# Patient Record
Sex: Female | Born: 1975
Health system: Southern US, Community
[De-identification: ages and names within clinical notes are randomized; demographics above are authoritative.]

## PROBLEM LIST (undated history)

## (undated) DIAGNOSIS — I10 Essential (primary) hypertension: Secondary | ICD-10-CM

---

## 2013-05-01 ENCOUNTER — Ambulatory Visit: Payer: Self-pay | Admitting: Physician Assistant

## 2013-05-01 LAB — CBC WITH DIFFERENTIAL/PLATELET
Basophil #: 0.1 10*3/uL (ref 0.0–0.1)
Basophil %: 0.6 %
Eosinophil #: 0.1 10*3/uL (ref 0.0–0.7)
Eosinophil %: 1 %
HGB: 13.8 g/dL (ref 12.0–16.0)
Lymphocyte #: 2.2 10*3/uL (ref 1.0–3.6)
Lymphocyte %: 25.2 %
MCV: 100 fL (ref 80–100)
Monocyte #: 0.6 x10 3/mm (ref 0.2–0.9)
Monocyte %: 7.1 %
Neutrophil #: 5.7 10*3/uL (ref 1.4–6.5)
Platelet: 232 10*3/uL (ref 150–440)
RBC: 4.09 10*6/uL (ref 3.80–5.20)
WBC: 8.6 10*3/uL (ref 3.6–11.0)

## 2014-10-06 ENCOUNTER — Emergency Department
Admit: 2014-10-06 | Disposition: A | Discharge status: Home / Self Care | Payer: Self-pay | Admitting: Emergency Medicine

## 2015-01-08 ENCOUNTER — Emergency Department
Admission: EM | Admit: 2015-01-08 | Discharge: 2015-01-08 | Disposition: A | Discharge status: Home / Self Care | Payer: Self-pay | Attending: Emergency Medicine | Admitting: Emergency Medicine

## 2015-01-08 DIAGNOSIS — Y9289 Other specified places as the place of occurrence of the external cause: Secondary | ICD-10-CM | POA: Insufficient documentation

## 2015-01-08 DIAGNOSIS — Y998 Other external cause status: Secondary | ICD-10-CM | POA: Insufficient documentation

## 2015-01-08 DIAGNOSIS — S40861A Insect bite (nonvenomous) of right upper arm, initial encounter: Secondary | ICD-10-CM | POA: Insufficient documentation

## 2015-01-08 DIAGNOSIS — L089 Local infection of the skin and subcutaneous tissue, unspecified: Secondary | ICD-10-CM

## 2015-01-08 DIAGNOSIS — W57XXXA Bitten or stung by nonvenomous insect and other nonvenomous arthropods, initial encounter: Secondary | ICD-10-CM | POA: Insufficient documentation

## 2015-01-08 DIAGNOSIS — Y9389 Activity, other specified: Secondary | ICD-10-CM | POA: Insufficient documentation

## 2015-01-08 MED ORDER — METHYLPREDNISOLONE SODIUM SUCC 125 MG IJ SOLR
125.0000 mg | Freq: Once | INTRAMUSCULAR | Status: AC
  Administered 2015-01-08: 125 mg via INTRAMUSCULAR
  Filled 2015-01-08: qty 2

## 2015-01-08 MED ORDER — HYDROXYZINE PAMOATE 25 MG PO CAPS: 25.0000 mg | ORAL_CAPSULE | Freq: Three times a day (TID) | ORAL | Status: DC | PRN

## 2015-01-08 MED ORDER — RANITIDINE HCL 150 MG PO TABS: 150.0000 mg | ORAL_TABLET | Freq: Two times a day (BID) | ORAL | Status: DC

## 2015-01-08 MED ORDER — EPINEPHRINE 0.3 MG/0.3ML IJ SOAJ: 0.3000 mg | Freq: Once | INTRAMUSCULAR | Status: DC

## 2015-01-08 NOTE — Discharge Instructions (Signed)

## 2015-01-08 NOTE — ED Notes (Signed)
Pt reports bug bite Sunday to rt arm, swelling and pain since.

## 2015-01-08 NOTE — ED Provider Notes (Signed)
[facility] Emergency Department Provider Note  ____________________________________________  Time seen: Approximately 2:50 PM  I have reviewed the triage vital signs and the nursing notes.   HISTORY  Chief Complaint Insect Bite    HPI Veronica Holt is a 39 y.o. female who presents for evaluation of an insect bite to her right forearm yesterday. States that the arm is swollen tender and hot to touch. Denies any difficulty breathing or shortness of breath  No past medical history on file.  There are no active problems to display for this patient.   No past surgical history on file.  Current Outpatient Rx  Name  Route  Sig  Dispense  Refill  . EPINEPHrine 0.3 mg/0.3 mL IJ SOAJ injection   Intramuscular   Inject 0.3 mLs (0.3 mg total) into the muscle once.   1 Device   1   . hydrOXYzine (VISTARIL) 25 MG capsule   Oral   Take 1 capsule (25 mg total) by mouth 3 (three) times daily as needed.   30 capsule   0   . ranitidine (ZANTAC) 150 MG tablet   Oral   Take 1 tablet (150 mg total) by mouth 2 (two) times daily.   14 tablet   1     Allergies Review of patient's allergies indicates no known allergies.  No family history on file.  Social History History  Substance Use Topics  . Smoking status: Not on file  . Smokeless tobacco: Not on file  . Alcohol Use: Not on file    Review of Systems Constitutional: No fever/chills Eyes: No visual changes. ENT: No sore throat. Cardiovascular: Denies chest pain. Respiratory: Denies shortness of breath. Gastrointestinal: No abdominal pain.  No nausea, no vomiting.  No diarrhea.  No constipation. Genitourinary: Negative for dysuria. Musculoskeletal: Negative for back pain. Skin: Positive for erythema around noontime from forearm of the right arm circumferentially erythematous and warm to palpation skin is taut. Neurological: Negative for headaches, focal weakness or numbness.  10-point ROS  otherwise negative.  ____________________________________________   PHYSICAL EXAM:  VITAL SIGNS: ED Triage Vitals  Enc Vitals Group     BP 01/08/15 1431 139/100 mmHg     Pulse Rate 01/08/15 1431 94     Resp --      Temp 01/08/15 1431 98.4 F (36.9 C)     Temp Source 01/08/15 1431 Oral     SpO2 01/08/15 1431 99 %     Weight 01/08/15 1424 186 lb (84.369 kg)     Height 01/08/15 1424 5\' 5"  (1.651 m)     Head Cir --      Peak Flow --      Pain Score 01/08/15 1424 8     Pain Loc --      Pain Edu? --      Excl. in [place]? --     Constitutional: Alert and oriented. Well appearing and in no acute distress. Mouth/Throat: Mucous membranes are moist.  Oropharynx non-erythematous. Neck: No stridor.   Cardiovascular: Normal rate, regular rhythm. Grossly normal heart sounds.  Good peripheral circulation. Respiratory: Normal respiratory effort.  No retractions. Lungs CTAB. Musculoskeletal: No lower extremity tenderness nor edema.  No joint effusions. Neurologic:  Normal speech and language. No gross focal neurologic deficits are appreciated. No gait instability. Skin:  Positive for erythema around noontime from forearm of the right arm circumferentially erythematous and warm to palpation skin is taut. Psychiatric: Mood and affect are normal. Speech and behavior are  normal.  ____________________________________________   LABS (all labs ordered are listed, but only abnormal results are displayed)  Labs Reviewed - No data to display ____________________________________________   PROCEDURES  Procedure(s) performed: None  Critical Care performed: No  ____________________________________________   INITIAL IMPRESSION / ASSESSMENT AND PLAN / ED COURSE  Pertinent labs & imaging results that were available during my care of the patient were reviewed by me and considered in my medical decision making (see chart for details).  Insect bite with secondary cellulitis. Concerned about  compartment syndrome. Site Medrol 125 IM given Rx for EpiPen provided. Rx for Vistaril and Zantac provided. No other emergency medical complaints and will return to the ER in 24 hours for recheck. ____________________________________________   FINAL CLINICAL IMPRESSION(S) / ED DIAGNOSES  Final diagnoses:  Insect bite of arm, right, infected, initial encounter      Arlyss Repress, PA-C 01/08/15 1534  Harvest Dark, MD 01/08/15 2220

## 2015-03-06 ENCOUNTER — Emergency Department: Payer: Self-pay

## 2015-03-06 ENCOUNTER — Encounter: Payer: Self-pay | Admitting: *Deleted

## 2015-03-06 ENCOUNTER — Emergency Department
Admission: EM | Admit: 2015-03-06 | Discharge: 2015-03-06 | Disposition: A | Discharge status: Home / Self Care | Payer: Self-pay | Attending: Emergency Medicine | Admitting: Emergency Medicine

## 2015-03-06 DIAGNOSIS — M722 Plantar fascial fibromatosis: Secondary | ICD-10-CM | POA: Insufficient documentation

## 2015-03-06 DIAGNOSIS — Z720 Tobacco use: Secondary | ICD-10-CM | POA: Insufficient documentation

## 2015-03-06 DIAGNOSIS — I10 Essential (primary) hypertension: Secondary | ICD-10-CM | POA: Insufficient documentation

## 2015-03-06 DIAGNOSIS — Z79899 Other long term (current) drug therapy: Secondary | ICD-10-CM | POA: Insufficient documentation

## 2015-03-06 DIAGNOSIS — M7732 Calcaneal spur, left foot: Secondary | ICD-10-CM | POA: Insufficient documentation

## 2015-03-06 HISTORY — DX: Essential (primary) hypertension: I10

## 2015-03-06 MED ORDER — IBUPROFEN 800 MG PO TABS: 800.0000 mg | ORAL_TABLET | Freq: Three times a day (TID) | ORAL | Status: DC | PRN

## 2015-03-06 MED ORDER — IBUPROFEN 800 MG PO TABS
800.0000 mg | ORAL_TABLET | Freq: Once | ORAL | Status: AC
  Administered 2015-03-06: 800 mg via ORAL
  Filled 2015-03-06: qty 1

## 2015-03-06 NOTE — ED Provider Notes (Signed)
[facility] Emergency Department Provider Note  ____________________________________________  Time seen: 4:00 AM  I have reviewed the triage vital signs and the nursing notes.   HISTORY  Chief Complaint Foot Pain      HPI Veronica Holt is a 39 y.o. female presents with left foot pain times several months. Patient states that the pain is worse after standing for long periods of time or walking. Current pain score is 6 out of 10 and nonradiating.     Past Medical History  Diagnosis Date  . Hypertension     There are no active problems to display for this patient.   History reviewed. No pertinent past surgical history.  Current Outpatient Rx  Name  Route  Sig  Dispense  Refill  . EPINEPHrine 0.3 mg/0.3 mL IJ SOAJ injection   Intramuscular   Inject 0.3 mLs (0.3 mg total) into the muscle once.   1 Device   1   . hydrOXYzine (VISTARIL) 25 MG capsule   Oral   Take 1 capsule (25 mg total) by mouth 3 (three) times daily as needed.   30 capsule   0   . ranitidine (ZANTAC) 150 MG tablet   Oral   Take 1 tablet (150 mg total) by mouth 2 (two) times daily.   14 tablet   1     Allergies Review of patient's allergies indicates no known allergies.  No family history on file.  Social History Social History  Substance Use Topics  . Smoking status: Current Every Day Smoker  . Smokeless tobacco: None  . Alcohol Use: No    Review of Systems  Constitutional: Negative for fever. Eyes: Negative for visual changes. ENT: Negative for sore throat. Cardiovascular: Negative for chest pain. Respiratory: Negative for shortness of breath. Gastrointestinal: Negative for abdominal pain, vomiting and diarrhea. Genitourinary: Negative for dysuria. Musculoskeletal: Negative for back pain.positive left foot pain Skin: Negative for rash. Neurological: Negative for headaches, focal weakness or numbness.   10-point ROS otherwise  negative.  ____________________________________________   PHYSICAL EXAM:  VITAL SIGNS: ED Triage Vitals  Enc Vitals Group     BP 03/06/15 0113 162/112 mmHg     Pulse Rate 03/06/15 0113 95     Resp 03/06/15 0113 18     Temp 03/06/15 0113 98.5 F (36.9 C)     Temp Source 03/06/15 0113 Oral     SpO2 03/06/15 0113 100 %     Weight --      Height --      Head Cir --      Peak Flow --      Pain Score 03/06/15 0113 10     Pain Loc --      Pain Edu? --      Excl. in [place]? --      Constitutional: Alert and oriented. Well appearing and in no distress. Eyes: Conjunctivae are normal. PERRL. Normal extraocular movements. ENT   Head: Normocephalic and atraumatic.   Nose: No congestion/rhinnorhea.   Mouth/Throat: Mucous membranes are moist.   Neck: No stridor. Hematological/Lymphatic/Immunilogical: No cervical lymphadenopathy. Cardiovascular: Normal rate, regular rhythm. Normal and symmetric distal pulses are present in all extremities. No murmurs, rubs, or gallops. Respiratory: Normal respiratory effort without tachypnea nor retractions. Breath sounds are clear and equal bilaterally. No wheezes/rales/rhonchi. Gastrointestinal: Soft and nontender. No distention. There is no CVA tenderness. Genitourinary: deferred Musculoskeletal: Tenderness to palpation along the plantar fascia and anterior calcaneus.  Skin:  Skin is warm, dry  and intact. No rash noted. Psychiatric: Mood and affect are normal. Speech and behavior are normal. Patient exhibits appropriate insight and judgment.      RADIOLOGY DG Foot 2 Views Left (Final result) Result time: 03/06/15 05:35:30   Final result by Rad Results In Interface (03/06/15 05:35:30)   Narrative:   CLINICAL DATA: Left heel pain. Pain for several months.  EXAM: LEFT FOOT - 2 VIEW  COMPARISON: None.  FINDINGS: No fracture or dislocation. The alignment and joint spaces are maintained. Small plantar calcaneal spur. No soft  tissue abnormality. No radiopaque foreign body.  IMPRESSION: No acute bony abnormality. Small plantar calcaneal spur.   Electronically Signed By: Jeb Levering M.D. On: 03/06/2015 05:35       INITIAL IMPRESSION / ASSESSMENT AND PLAN / ED COURSE  Pertinent labs & imaging results that were available during my care of the patient were reviewed by me and considered in my medical decision making (see chart for details).    ____________________________________________   FINAL CLINICAL IMPRESSION(S) / ED DIAGNOSES  Final diagnoses:  Plantar fasciitis, left  Calcaneal spur of foot, left      Gregor Hams, MD 03/07/15 [phone]

## 2015-03-06 NOTE — Discharge Instructions (Signed)
Plantar Fasciitis °Plantar fasciitis is a common condition that causes foot pain. It is soreness (inflammation) of the band of tough fibrous tissue on the bottom of the foot that runs from the heel bone (calcaneus) to the ball of the foot. The cause of this soreness may be from excessive standing, poor fitting shoes, running on hard surfaces, being overweight, having an abnormal walk, or overuse (this is common in runners) of the painful foot or feet. It is also common in aerobic exercise dancers and ballet dancers. °SYMPTOMS  °Most people with plantar fasciitis complain of: °· Severe pain in the morning on the bottom of their foot especially when taking the first steps out of bed. This pain recedes after a few minutes of walking. °· Severe pain is experienced also during walking following a long period of inactivity. °· Pain is worse when walking barefoot or up stairs °DIAGNOSIS  °· Your caregiver will diagnose this condition by examining and feeling your foot. °· Special tests such as X-rays of your foot, are usually not needed. °PREVENTION  °· Consult a sports medicine professional before beginning a new exercise program. °· Walking programs offer a good workout. With walking there is a lower chance of overuse injuries common to runners. There is less impact and less jarring of the joints. °· Begin all new exercise programs slowly. If problems or pain develop, decrease the amount of time or distance until you are at a comfortable level. °· Wear good shoes and replace them regularly. °· Stretch your foot and the heel cords at the back of the ankle (Achilles tendon) both before and after exercise. °· Run or exercise on even surfaces that are not hard. For example, asphalt is better than pavement. °· Do not run barefoot on hard surfaces. °· If using a treadmill, vary the incline. °· Do not continue to workout if you have foot or joint problems. Seek professional help if they do not improve. °HOME CARE INSTRUCTIONS    °· Avoid activities that cause you pain until you recover. °· Use ice or cold packs on the problem or painful areas after working out. °· Only take over-the-counter or prescription medicines for pain, discomfort, or fever as directed by your caregiver. °· Soft shoe inserts or athletic shoes with air or gel sole cushions may be helpful. °· If problems continue or become more severe, consult a sports medicine caregiver or your own health care provider. Cortisone is a potent anti-inflammatory medication that may be injected into the painful area. You can discuss this treatment with your caregiver. °MAKE SURE YOU:  °· Understand these instructions. °· Will watch your condition. °· Will get help right away if you are not doing well or get worse. °Document Released: 03/03/2001 Document Revised: 08/31/2011 Document Reviewed: 05/02/2008 °ExitCare® Patient Information ©2015 [employer]. This information is not intended to replace advice given to you by your health care provider. Make sure you discuss any questions you have with your health care provider. ° °Heel Spur °A heel spur is a hook of bone that can form on the calcaneus (the heel bone and the largest bone of the foot). Heel spurs are often associated with plantar fasciitis and usually come in people who have had the problem for an extended period of time. The cause of the relationship is unknown. The pain associated with them is thought to be caused by an inflammation (soreness and redness) of the plantar fascia rather than the spur itself. The plantar fascia is a thick   fibrous like tissue that runs from the calcaneus (heel bone) to the ball of the foot. This strong, tight tissue helps maintain the arch of your foot. It helps distribute the weight across your foot as you walk or run. Stresses placed on the plantar fascia can be tremendous. When it is inflamed normal activities become painful. Pain is worse in the morning after sleeping. After sleeping the plantar  fascia is tight. The first movements stretch the fascia and this causes pain. As the tendon loosens, the pain usually gets better. It often returns with too much standing or walking.  °About 70% of patients with plantar fasciitis have a heel spur. About half of people without foot pain also have heel spurs. °DIAGNOSIS  °The diagnosis of a heel spur is made by X-ray. The X-ray shows a hook of bone protruding from the bottom of the calcaneus at the point where the plantar fascia is attached to the heel bone.  °TREATMENT °· It is necessary to find out what is causing the stretching of the plantar fascia. If the cause is over-pronation (flat feet), orthotics and proper foot ware may help. °· Stretching exercises, losing weight, wearing shoes that have a cushioned heel that absorbs shock, and elevating the heel with the use of a heel cradle, heel cup, or orthotics may all help. Heel cradles and heel cups provide extra comfort and cushion to the heel, and reduce the amount of shock to the sore area. °AVOIDING THE PAIN OF PLANTAR FASCIITIS AND HEEL SPURS °· Consult a sports medicine professional before beginning a new exercise program. °· Walking programs offer a good workout. There is a lower chance of overuse injuries common to the runners. There is less impact and less jarring of the joints. °· Begin all new exercise programs slowly. If problems or pains develop, decrease the amount of time or distance until you are at a comfortable level. °· Wear good shoes and replace them regularly. °· Stretch your foot and the heel cords at the back of the ankle (Achilles tendons) both before and after exercise. °· Run or exercise on even surfaces that are not hard. For example, asphalt is better than pavement. °· Do not run barefoot on hard surfaces. °· If using a treadmill, vary the incline. °· Do not continue to workout if you have foot or joint problems. Seek professional help if they do not improve. °HOME CARE INSTRUCTIONS   °· Avoid activities that cause you pain until you recover. °· Use ice or cold packs to the problem or painful areas after working out. °· Only take over-the-counter or prescription medicines for pain, discomfort, or fever as directed by your caregiver. °· Soft shoe inserts or athletic shoes with air or gel sole cushions may be helpful. °· If problems continue or become more severe, consult a sports medicine caregiver. Cortisone is a potent anti-inflammatory medication that may be injected into the painful area. You can discuss this treatment with your caregiver. °MAKE SURE YOU:  °· Understand these instructions. °· Will watch your condition. °· Will get help right away if you are not doing well or get worse. °Document Released: 07/15/2005 Document Revised: 08/31/2011 Document Reviewed: 08/09/2013 °ExitCare® Patient Information ©2015 [employer]. This information is not intended to replace advice given to you by your health care provider. Make sure you discuss any questions you have with your health care provider. ° °

## 2015-03-06 NOTE — ED Notes (Addendum)
Pt reports pain on the bottom of her left foot at her heel area for several months, pain is worse after she sits for long periods and then goes to stand up.

## 2016-07-13 DIAGNOSIS — N62 Hypertrophy of breast: Secondary | ICD-10-CM | POA: Diagnosis not present

## 2016-08-03 DIAGNOSIS — R000 Tachycardia, unspecified: Secondary | ICD-10-CM | POA: Diagnosis not present

## 2016-08-03 DIAGNOSIS — I10 Essential (primary) hypertension: Secondary | ICD-10-CM | POA: Diagnosis not present

## 2016-08-03 DIAGNOSIS — M25511 Pain in right shoulder: Secondary | ICD-10-CM | POA: Diagnosis not present

## 2016-08-03 DIAGNOSIS — R252 Cramp and spasm: Secondary | ICD-10-CM | POA: Diagnosis not present

## 2016-08-03 DIAGNOSIS — Z01810 Encounter for preprocedural cardiovascular examination: Secondary | ICD-10-CM | POA: Diagnosis not present

## 2016-08-03 DIAGNOSIS — M546 Pain in thoracic spine: Secondary | ICD-10-CM | POA: Diagnosis not present

## 2016-08-03 DIAGNOSIS — M62830 Muscle spasm of back: Secondary | ICD-10-CM | POA: Diagnosis not present

## 2016-08-20 DIAGNOSIS — M62830 Muscle spasm of back: Secondary | ICD-10-CM | POA: Diagnosis not present

## 2016-08-20 DIAGNOSIS — I10 Essential (primary) hypertension: Secondary | ICD-10-CM | POA: Diagnosis not present

## 2016-11-30 DIAGNOSIS — M79604 Pain in right leg: Secondary | ICD-10-CM | POA: Diagnosis not present

## 2016-11-30 DIAGNOSIS — M62838 Other muscle spasm: Secondary | ICD-10-CM | POA: Diagnosis not present

## 2016-11-30 DIAGNOSIS — I10 Essential (primary) hypertension: Secondary | ICD-10-CM | POA: Diagnosis not present

## 2016-12-07 DIAGNOSIS — I10 Essential (primary) hypertension: Secondary | ICD-10-CM | POA: Diagnosis not present

## 2016-12-07 DIAGNOSIS — Z131 Encounter for screening for diabetes mellitus: Secondary | ICD-10-CM | POA: Diagnosis not present

## 2017-01-14 ENCOUNTER — Encounter: Payer: Self-pay | Admitting: Physician Assistant

## 2017-01-14 ENCOUNTER — Ambulatory Visit (INDEPENDENT_AMBULATORY_CARE_PROVIDER_SITE_OTHER): Payer: 59 | Admitting: Physician Assistant

## 2017-01-14 VITALS — BP 146/93 | HR 76 | Temp 98.5°F | Resp 16 | Ht 65.0 in | Wt 228.0 lb

## 2017-01-14 DIAGNOSIS — M542 Cervicalgia: Secondary | ICD-10-CM

## 2017-01-14 DIAGNOSIS — R51 Headache: Secondary | ICD-10-CM | POA: Diagnosis not present

## 2017-01-14 DIAGNOSIS — R519 Headache, unspecified: Secondary | ICD-10-CM

## 2017-01-14 MED ORDER — MELOXICAM 7.5 MG PO TABS: 7.5000 mg | ORAL_TABLET | Freq: Every day | ORAL | 0 refills | Status: DC

## 2017-01-14 MED ORDER — TIZANIDINE HCL 2 MG PO CAPS: 2.0000 mg | ORAL_CAPSULE | Freq: Three times a day (TID) | ORAL | 0 refills | Status: DC

## 2017-01-14 NOTE — Patient Instructions (Addendum)
Acupuncture: [place] (sliding scale) or [place].  Try chiropractor and massage.  Apply heating pad to your neck 20-30 min a time 2-3 times daily.  Eat meals on a regular schedule. Limit alcohol use. Decrease your caffeine intake, or stop using caffeine Limit stress.  Sit up straight, and avoid tensing your muscles. Do not use tobacco products, including cigarettes, chewing tobacco, or e-cigarettes. If you need help quitting, ask your health care provider. Exercise regularly as told by your health care provider. Get 7-9 hours of sleep, or the amount recommended by your health care provider.  Come back if you are not improving in 3-4 weeks.  Thank you for coming in today. I hope you feel we met your needs.  Feel free to call PCP if you have any questions or further requests.  Please consider signing up for MyChart if you do not already have it, as this is a great way to communicate with me.  Best,  [employer], PA-C    1. Using good posture, sit on a stable chair or stand up. 2. Without moving your shoulders, slowly tilt your left / right ear to your shoulder until you feel a stretch in your neck muscles. You should be looking straight ahead. 3. Hold for __________ seconds. 4. Repeat with the other side of your neck. Repeat __________ times. Complete this exercise __________ times a day. Exercise B: Cervical rotation  1. Using good posture, sit on a stable chair or stand up. 2. Slowly turn your head to the side as if you are looking over your left / right shoulder. ? Keep your eyes level with the ground. ? Stop when you feel a stretch along the side and the back of your neck. 3. Hold for __________ seconds. 4. Repeat this by turning to your other side. Repeat __________ times. Complete this exercise __________ times a day. Exercise C: Thoracic extension and pectoral stretch 1. Roll a towel or a small blanket so it is about 4 inches (10 cm) in diameter. 2. Lie down on your  back on a firm surface. 3. Put the towel lengthwise, under your spine in the middle of your back. It should not be not under your shoulder blades. The towel should line up with your spine from your middle back to your lower back. 4. Put your hands behind your head and let your elbows fall out to your sides. 5. Hold for __________ seconds. Repeat __________ times. Complete this exercise __________ times a day. Strengthening exercises These exercises build strength and endurance in your neck. Endurance is the ability to use your muscles for a long time, even after your muscles get tired. Exercise D: Upper cervical flexion, isometric 1. Lie on your back with a thin pillow behind your head and a small rolled-up towel under your neck. 2. Gently tuck your chin toward your chest and nod your head down to look toward your feet. Do not lift your head off the pillow. 3. Hold for __________ seconds. 4. Release the tension slowly. Relax your neck muscles completely before you repeat this exercise. Repeat __________ times. Complete this exercise __________ times a day. Exercise E: Cervical extension, isometric  1. Stand about 6 inches (15 cm) away from a wall, with your back facing the wall. 2. Place a soft object, about 6-8 inches (15-20 cm) in diameter, between the back of your head and the wall. A soft object could be a small pillow, a ball, or a folded towel. 3. Gently  tilt your head back and press into the soft object. Keep your jaw and forehead relaxed. 4. Hold for __________ seconds. 5. Release the tension slowly. Relax your neck muscles completely before you repeat this exercise. Repeat __________ times. Complete this exercise __________ times a day. Posture and body mechanics  Body mechanics refers to the movements and positions of your body while you do your daily activities. Posture is part of body mechanics. Good posture and healthy body mechanics can help to relieve stress in your body's  tissues and joints. Good posture means that your spine is in its natural S-curve position (your spine is neutral), your shoulders are pulled back slightly, and your head is not tipped forward. The following are general guidelines for applying improved posture and body mechanics to your everyday activities. Standing  When standing, keep your spine neutral and keep your feet about hip-width apart. Keep a slight bend in your knees. Your ears, shoulders, and hips should line up.  When you do a task in which you stand in one place for a long time, place one foot up on a stable object that is 2-4 inches (5-10 cm) high, such as a footstool. This helps keep your spine neutral. Sitting   When sitting, keep your spine neutral and your keep feet flat on the floor. Use a footrest, if necessary, and keep your thighs parallel to the floor. Avoid rounding your shoulders, and avoid tilting your head forward.  When working at a desk or a computer, keep your desk at a height where your hands are slightly lower than your elbows. Slide your chair under your desk so you are close enough to maintain good posture.  When working at a computer, place your monitor at a height where you are looking straight ahead and you do not have to tilt your head forward or downward to look at the screen. Resting When lying down and resting, avoid positions that are most painful for you. Try to support your neck in a neutral position. You can use a contour pillow or a small rolled-up towel. Your pillow should support your neck but not push on it. This information is not intended to replace advice given to you by your health care provider. Make sure you discuss any questions you have with your health care provider. Document Released: 06/08/2005 Document Revised: 02/13/2016 Document Reviewed: 05/15/2015 Elsevier Interactive Patient Education  2018 [employer].  IF you received an x-ray today, you will receive an invoice from [facility]  Radiology. Please contact [facility] at [phone] with questions or concerns regarding your invoice.   IF you received labwork today, you will receive an invoice from [facility]. Please contact [facility] at [phone] with questions or concerns regarding your invoice.   Our billing staff will not be able to assist you with questions regarding bills from these companies.  You will be contacted with the lab results as soon as they are available. The fastest way to get your results is to activate your My Chart account. Instructions are located on the last page of this paperwork. If you have not heard from [place] regarding the results in 2 weeks, please contact this office.

## 2017-01-14 NOTE — Progress Notes (Signed)
Veronica Holt  MRN: [id number] DOB: 11-01-1975  PCP: Patient, No Pcp Per  Subjective:  Pt is a 41 year old female who presents to clinic for headache, back pain and neck pain. She works in an [employer], television/film set.  Headache x 3 days. Pain starts at base of her skull and radiates over top of her head. Pain is "mild". Does not interfere with daily activities. No visual disturbances. Some photophobia. No phonophobia. She does not have to lay in dark room to feel better. Pain is not throbbing.  Endorses increase in recent life stressors and physical activity. She moved homes last weekend. She does not stay well hydrated.  She is sleeping well.  Low back pain. This is not a new problem. Pain does not radiate. She has large breasts, has discussed this as contributing factor to back pain with her PCP and is planning to have breast reduction surgery in the future.  She has tried Aleve w no relief.   PCP Dr. Janene Harvey at [facility] primary care.   Review of Systems  Constitutional: Negative for chills, diaphoresis, fatigue and fever.  Eyes: Negative for visual disturbance.  Gastrointestinal: Negative for nausea and vomiting.  Musculoskeletal: Positive for back pain and neck pain. Negative for neck stiffness.  Neurological: Positive for headaches. Negative for dizziness, weakness and light-headedness.  Psychiatric/Behavioral: Negative for confusion and sleep disturbance.    There are no active problems to display for this patient.   Current Outpatient Prescriptions on File Prior to Visit  Medication Sig Dispense Refill  . EPINEPHrine 0.3 mg/0.3 mL IJ SOAJ injection Inject 0.3 mLs (0.3 mg total) into the muscle once. (Patient not taking: Reported on 01/14/2017) 1 Device 1  . hydrOXYzine (VISTARIL) 25 MG capsule Take 1 capsule (25 mg total) by mouth 3 (three) times daily as needed. (Patient not taking: Reported on 01/14/2017) 30 capsule 0  . ibuprofen (ADVIL,MOTRIN) 800 MG tablet Take 1 tablet (800 mg total)  by mouth every 8 (eight) hours as needed. (Patient not taking: Reported on 01/14/2017) 30 tablet 0  . ranitidine (ZANTAC) 150 MG tablet Take 1 tablet (150 mg total) by mouth 2 (two) times daily. (Patient not taking: Reported on 01/14/2017) 14 tablet 1   No current facility-administered medications on file prior to visit.     No Known Allergies   Objective:  BP (!) 146/93 (BP Location: Right Arm, Patient Position: Sitting, Cuff Size: Large)   Pulse 76   Temp 98.5 F (36.9 C) (Oral)   Resp 16   Ht 5\' 5"  (1.651 m)   Wt 228 lb (103.4 kg)   LMP 12/31/2016   SpO2 98%   BMI 37.94 kg/m   Physical Exam  Constitutional: She is oriented to person, place, and time and well-developed, well-nourished, and in no distress. No distress.  Eyes: Pupils are equal, round, and reactive to light. Right eye exhibits no nystagmus. Left eye exhibits no nystagmus.  Cardiovascular: Normal rate, regular rhythm and normal heart sounds.   Neurological: She is alert and oriented to person, place, and time. She has normal motor skills. GCS score is 15.  Skin: Skin is warm and dry.  Psychiatric: Mood, memory, affect and judgment normal.  Vitals reviewed.   Assessment and Plan :  1. Nonintractable episodic headache, unspecified headache type 2. Neck pain - tizanidine (ZANAFLEX) 2 MG capsule; Take 1 capsule (2 mg total) by mouth 3 (three) times daily. May take 4mg  3 times daily  Dispense: 30 capsule; Refill: 0 -  meloxicam (MOBIC) 7.5 MG tablet; Take 1 tablet (7.5 mg total) by mouth daily. Max dose 15mg /day.  Dispense: 30 tablet; Refill: 0 - Suspect recent increased life stressors and physical activity are contributing factors to neck pain. Suspect headache is secondary to neck pain. No red flag signs or symptoms.  Advised heat, massage, hydration and stretching. RTC in 3-4 weeks if no improvement.  She agrees with plan.   Mercer Pod, PA-C  Primary Care at [place] 01/14/2017 9:50 AM

## 2017-02-05 ENCOUNTER — Telehealth: Payer: Self-pay

## 2017-02-05 NOTE — Telephone Encounter (Signed)
Received clarification request from [facility] if OK to change Tizanidine 2mg  Cap to Tablets. Capsules unavailable. Ok Per [employer] PA, to change to tablets. Pharmacy notified.

## 2017-02-24 ENCOUNTER — Ambulatory Visit (INDEPENDENT_AMBULATORY_CARE_PROVIDER_SITE_OTHER): Payer: 59 | Admitting: Physician Assistant

## 2017-02-24 ENCOUNTER — Encounter: Payer: Self-pay | Admitting: Physician Assistant

## 2017-02-24 VITALS — BP 126/85 | HR 74 | Temp 98.7°F | Resp 17 | Ht 65.5 in | Wt 226.0 lb

## 2017-02-24 DIAGNOSIS — R519 Headache, unspecified: Secondary | ICD-10-CM

## 2017-02-24 DIAGNOSIS — R51 Headache: Secondary | ICD-10-CM | POA: Diagnosis not present

## 2017-02-24 LAB — POCT URINALYSIS DIP (MANUAL ENTRY)
BILIRUBIN UA: NEGATIVE mg/dL
Bilirubin, UA: NEGATIVE
Blood, UA: NEGATIVE
Glucose, UA: NEGATIVE mg/dL
LEUKOCYTES UA: NEGATIVE
Nitrite, UA: NEGATIVE
PH UA: 5.5 (ref 5.0–8.0)
PROTEIN UA: NEGATIVE mg/dL
SPEC GRAV UA: 1.025 (ref 1.010–1.025)
UROBILINOGEN UA: 1 U/dL

## 2017-02-24 LAB — POCT CBC
Granulocyte percent: 59.7 %G (ref 37–80)
HCT, POC: 40.6 % (ref 37.7–47.9)
Hemoglobin: 14.4 g/dL (ref 12.2–16.2)
Lymph, poc: 2.8 (ref 0.6–3.4)
MCH, POC: 31.6 pg — AB (ref 27–31.2)
MCHC: 35.5 g/dL — AB (ref 31.8–35.4)
MCV: 89 fL (ref 80–97)
MID (CBC): 0.7 (ref 0–0.9)
MPV: 7.8 fL (ref 0–99.8)
PLATELET COUNT, POC: 259 10*3/uL (ref 142–424)
POC Granulocyte: 5.1 (ref 2–6.9)
POC LYMPH %: 32.3 % (ref 10–50)
POC MID %: 8 % (ref 0–12)
RBC: 4.56 M/uL (ref 4.04–5.48)
RDW, POC: 12.6 %
WBC: 8.6 10*3/uL (ref 4.6–10.2)

## 2017-02-24 LAB — POCT URINE PREGNANCY: PREG TEST UR: NEGATIVE

## 2017-02-24 MED ORDER — FLUTICASONE PROPIONATE 50 MCG/ACT NA SUSP: 2.0000 | Freq: Every day | NASAL | 1 refills | Status: DC

## 2017-02-24 NOTE — Patient Instructions (Addendum)
Please stop the energy drinks.   Please use saline nasal rinse and flonase to open the nasal passages. I would like you to increase your hydration to 64oz per day which is about 64 oz per day.   I will follow up with the lab results.  General Headache Without Cause A headache is pain or discomfort felt around the head or neck area. There are many causes and types of headaches. In some cases, the cause may not be found. Follow these instructions at home: Managing pain  Take over-the-counter and prescription medicines only as told by your doctor.  Lie down in a dark, quiet room when you have a headache.  If directed, apply ice to the head and neck area: ? Put ice in a plastic bag. ? Place a towel between your skin and the bag. ? Leave the ice on for 20 minutes, 2-3 times per day.  Use a heating pad or hot shower to apply heat to the head and neck area as told by your doctor.  Keep lights dim if bright lights bother you or make your headaches worse. Eating and drinking  Eat meals on a regular schedule.  Lessen how much alcohol you drink.  Lessen how much caffeine you drink, or stop drinking caffeine. General instructions  Keep all follow-up visits as told by your doctor. This is important.  Keep a journal to find out if certain things bring on headaches. For example, write down: ? What you eat and drink. ? How much sleep you get. ? Any change to your diet or medicines.  Relax by getting a massage or doing other relaxing activities.  Lessen stress.  Sit up straight. Do not tighten (tense) your muscles.  Do not use tobacco products. This includes cigarettes, chewing tobacco, or e-cigarettes. If you need help quitting, ask your doctor.  Exercise regularly as told by your doctor.  Get enough sleep. This often means 7-9 hours of sleep. Contact a doctor if:  Your symptoms are not helped by medicine.  You have a headache that feels different than the other  headaches.  You feel sick to your stomach (nauseous) or you throw up (vomit).  You have a fever. Get help right away if:  Your headache becomes really bad.  You keep throwing up.  You have a stiff neck.  You have trouble seeing.  You have trouble speaking.  You have pain in the eye or ear.  Your muscles are weak or you lose muscle control.  You lose your balance or have trouble walking.  You feel like you will pass out (faint) or you pass out.  You have confusion. This information is not intended to replace advice given to you by your health care provider. Make sure you discuss any questions you have with your health care provider. Document Released: 03/17/2008 Document Revised: 11/14/2015 Document Reviewed: 10/01/2014 Elsevier Interactive Patient Education  2018 [employer].      IF you received an x-ray today, you will receive an invoice from [facility]. Please contact [facility] at [phone] with questions or concerns regarding your invoice.   IF you received labwork today, you will receive an invoice from [facility]. Please contact [facility] at [phone] with questions or concerns regarding your invoice.   Our billing staff will not be able to assist you with questions regarding bills from these companies.  You will be contacted with the lab results as soon as they are available. The fastest way to get your results  is to activate your My Chart account. Instructions are located on the last page of this paperwork. If you have not heard from us regarding the results in 2 weeks, please contact this office.     

## 2017-02-24 NOTE — Progress Notes (Signed)
PRIMARY CARE AT [facility] [address], [place] [address] [phone]  Date:  02/24/2017   Name:  Veronica Holt   DOB:  Apr 20, 1976   MRN:  [id number]  PCP:  System, Pcp Not In    History of Present Illness:  Veronica Holt is a 41 y.o. female patient who presents to PCP with  Chief Complaint  Patient presents with  . hbp     --3 days of headache at the front of face.  She has nausea associated and no appetite.  Subjective fever and chills.  She has seen some spotting during those times.  No dizziness.  She missed doses for a couple weeks.  She has had her medication for the last 2 weeks.  Her pcp is Veronica Holt at [facility] primary care.  She has blood pressure hydrochlorothiazide which did not help.  She has been having a dry cough for the last for a year.  She also states that within less than 24 hours of drinking a large energy drink, her symptoms started.  She does not frequently drink this. She has also had some nasal congestion.  --checked blood pressure yesterday, and BP was 156/104.  No lower leg swelling.  She has been having some lower leg spasms.  She is drinking about 2 bottles per day.  She has just been taking her medications.  No added stressors besides work.      There are no active problems to display for this patient.   No past medical history on file.  No past surgical history on file.  Social History  Substance Use Topics  . Smoking status: Former Smoker    Quit date: 01/14/2013  . Smokeless tobacco: Never Used  . Alcohol use No    No family history on file.  No Known Allergies  Medication list has been reviewed and updated.  Current Outpatient Prescriptions on File Prior to Visit  Medication Sig Dispense Refill  . EPINEPHrine 0.3 mg/0.3 mL IJ SOAJ injection Inject 0.3 mLs (0.3 mg total) into the muscle once. (Patient not taking: Reported on 01/14/2017) 1 Device 1   No current facility-administered medications on file prior to visit.     ROS ROS  otherwise unremarkable unless listed above.  Physical Examination: BP 126/85   Pulse 74   Temp 98.7 F (37.1 C) (Oral)   Resp 17   Ht 5' 5.5" (1.664 m)   Wt 226 lb (102.5 kg)   LMP 02/07/2017 (Approximate)   SpO2 98%   BMI 37.04 kg/m  Ideal Body Weight: Weight in (lb) to have BMI = 25: 152.2  Physical Exam  Constitutional: She is oriented to person, place, and time. She appears well-developed and well-nourished. No distress.  HENT:  Head: Normocephalic and atraumatic.  Right Ear: Tympanic membrane, external ear and ear canal normal.  Left Ear: Tympanic membrane, external ear and ear canal normal.  Nose: Mucosal edema and rhinorrhea present. Right sinus exhibits no maxillary sinus tenderness and no frontal sinus tenderness. Left sinus exhibits no maxillary sinus tenderness and no frontal sinus tenderness.  Mouth/Throat: No uvula swelling. No oropharyngeal exudate, posterior oropharyngeal edema or posterior oropharyngeal erythema.  Tender at the upper area of nasal bridge between eyes.  Eyes: Pupils are equal, round, and reactive to light. Conjunctivae and EOM are normal.  Cardiovascular: Normal rate and regular rhythm.  Exam reveals no gallop, no distant heart sounds and no friction rub.   No murmur heard. Pulmonary/Chest: Effort normal. No apnea.  No respiratory distress. She has no decreased breath sounds. She has no wheezes. She has no rhonchi.  Lymphadenopathy:       Head (right side): No submandibular, no tonsillar, no preauricular and no posterior auricular adenopathy present.       Head (left side): No submandibular, no tonsillar, no preauricular and no posterior auricular adenopathy present.  Neurological: She is alert and oriented to person, place, and time. She has normal strength. No cranial nerve deficit.  Skin: She is not diaphoretic.  Psychiatric: She has a normal mood and affect. Her behavior is normal.     Assessment and Plan: Veronica Holt is a 41 y.o. female  who is here today for cc of  Chief Complaint  Patient presents with  . hbp  advised to stop energy drinks Saline nasal rinse  And Flonase to open nasal passages. Advised hydration 64 oz per day. May consider abx for head pain if symptoms do not improve. Acute nonintractable headache, unspecified headache type - Plan: POCT urine pregnancy, Basic metabolic panel, TSH, POCT urinalysis dipstick, POCT CBC, CANCELED: CBC  Ivar Drape, PA-C [facility] 9/9/20187:29 PM

## 2017-02-25 LAB — BASIC METABOLIC PANEL
BUN / CREAT RATIO: 16 (ref 9–23)
BUN: 10 mg/dL (ref 6–24)
CO2: 21 mmol/L (ref 20–29)
Calcium: 9 mg/dL (ref 8.7–10.2)
Chloride: 101 mmol/L (ref 96–106)
Creatinine, Ser: 0.64 mg/dL (ref 0.57–1.00)
GFR calc Af Amer: 129 mL/min/{1.73_m2} (ref >59)
GFR calc non Af Amer: 112 mL/min/{1.73_m2} (ref >59)
GLUCOSE: 98 mg/dL (ref 65–99)
POTASSIUM: 4.1 mmol/L (ref 3.5–5.2)
SODIUM: 135 mmol/L (ref 134–144)

## 2017-02-25 LAB — TSH: TSH: 1.46 u[IU]/mL (ref 0.450–4.500)

## 2017-03-01 ENCOUNTER — Other Ambulatory Visit: Payer: Self-pay | Admitting: Physician Assistant

## 2017-03-01 MED ORDER — CETIRIZINE HCL 10 MG PO TABS: 10.0000 mg | ORAL_TABLET | Freq: Every day | ORAL | 1 refills | Status: DC

## 2017-03-02 DIAGNOSIS — Z1231 Encounter for screening mammogram for malignant neoplasm of breast: Secondary | ICD-10-CM | POA: Diagnosis not present

## 2017-03-02 DIAGNOSIS — Z01419 Encounter for gynecological examination (general) (routine) without abnormal findings: Secondary | ICD-10-CM | POA: Diagnosis not present

## 2017-03-02 DIAGNOSIS — N926 Irregular menstruation, unspecified: Secondary | ICD-10-CM | POA: Diagnosis not present

## 2017-03-10 DIAGNOSIS — N920 Excessive and frequent menstruation with regular cycle: Secondary | ICD-10-CM | POA: Diagnosis not present

## 2017-03-10 DIAGNOSIS — N760 Acute vaginitis: Secondary | ICD-10-CM | POA: Diagnosis not present

## 2017-05-24 ENCOUNTER — Ambulatory Visit: Payer: 59 | Admitting: Physician Assistant

## 2017-05-24 DIAGNOSIS — M545 Low back pain: Secondary | ICD-10-CM | POA: Diagnosis not present

## 2017-08-11 ENCOUNTER — Encounter: Payer: Self-pay | Admitting: Physician Assistant

## 2017-08-11 ENCOUNTER — Other Ambulatory Visit: Payer: Self-pay

## 2017-08-11 ENCOUNTER — Ambulatory Visit: Payer: 59 | Admitting: Physician Assistant

## 2017-08-11 VITALS — HR 92 | Temp 98.5°F | Resp 16 | Ht 66.0 in | Wt 223.6 lb

## 2017-08-11 DIAGNOSIS — S39012A Strain of muscle, fascia and tendon of lower back, initial encounter: Secondary | ICD-10-CM | POA: Diagnosis not present

## 2017-08-11 NOTE — Progress Notes (Signed)
PRIMARY CARE AT [place], [place] [address] [phone]  Date:  08/11/2017   Name:  Veronica Holt   DOB:  1976-04-30   MRN:  [id number]  PCP:  System, Pcp Not In    History of Present Illness:  Veronica Holt is a 42 y.o. female patient who presents to PCP with  Chief Complaint  Patient presents with  . Back Pain    lower x 1 wk, per pt "had fallen x 1 wk ago, pain had gone away, but get sharp pain, stiff neck x 3 days ago"     Patient is here for low back pain that has come and gone for the last year.  She notes that over 1 week of a sharp pain.  Aggravated with bending down, getting out of the bed, and pain in her neck, and lifting.  No paresthesia of the extremity.  1 week ago, she fell in her back yard.  She fell on the right side.  No bruising or swelling.  This was a hard dirt surface.  She has using a heating pad, which helps some.       There are no active problems to display for this patient.   No past medical history on file.  No past surgical history on file.  Social History   Tobacco Use  . Smoking status: Former Smoker    Last attempt to quit: 01/14/2013    Years since quitting: 4.5  . Smokeless tobacco: Never Used  Substance Use Topics  . Alcohol use: No  . Drug use: No    No family history on file.  No Known Allergies  Medication list has been reviewed and updated.  Current Outpatient Medications on File Prior to Visit  Medication Sig Dispense Refill  . amLODipine (NORVASC) 10 MG tablet Take 10 mg by mouth daily.    . carvedilol (COREG) 3.125 MG tablet Take 3.125 mg by mouth 2 (two) times daily with a meal.    . furosemide (LASIX) 20 MG tablet Take 20 mg by mouth.    . hydrochlorothiazide (HYDRODIURIL) 25 MG tablet Take 25 mg by mouth daily.    . cetirizine (ZYRTEC) 10 MG tablet Take 1 tablet (10 mg total) by mouth daily. (Patient not taking: Reported on 08/11/2017) 30 tablet 1  . EPINEPHrine 0.3 mg/0.3 mL IJ SOAJ injection Inject  0.3 mLs (0.3 mg total) into the muscle once. (Patient not taking: Reported on 01/14/2017) 1 Device 1  . fluticasone (FLONASE) 50 MCG/ACT nasal spray Place 2 sprays into both nostrils daily. (Patient not taking: Reported on 08/11/2017) 16 g 1   No current facility-administered medications on file prior to visit.     ROS ROS otherwise unremarkable unless listed above.  Physical Examination: Pulse 92   Temp 98.5 F (36.9 C) (Oral)   Resp 16   Ht 5\' 6"  (1.676 m)   Wt 223 lb 9.6 oz (101.4 kg)   LMP 08/04/2017   SpO2 96%   BMI 36.09 kg/m  Ideal Body Weight: Weight in (lb) to have BMI = 25: 154.6  Physical Exam  Constitutional: She is oriented to person, place, and time. She appears well-developed and well-nourished. No distress.  HENT:  Head: Normocephalic and atraumatic.  Right Ear: External ear normal.  Left Ear: External ear normal.  Eyes: Conjunctivae and EOM are normal. Pupils are equal, round, and reactive to light.  Cardiovascular: Normal rate.  Pulmonary/Chest: Effort normal. No respiratory distress.  Musculoskeletal:  Cervical back: She exhibits no bony tenderness.       Lumbar back: She exhibits decreased range of motion and tenderness. She exhibits no bony tenderness, no swelling and no spasm.  Neurological: She is alert and oriented to person, place, and time.  Skin: She is not diaphoretic.  Psychiatric: She has a normal mood and affect. Her behavior is normal.     Assessment and Plan: [name] is a 42 y.o. female who is here today for cc of  Chief Complaint  Patient presents with  . Back Pain    lower x 1 wk, per pt "had fallen x 1 wk ago, pain had gone away, but get sharp pain, stiff neck x 3 days ago"    Strain of lumbar region, initial encounter  Ivar Drape, PA-C [facility] 3/1/201911:23 AM

## 2017-08-11 NOTE — Patient Instructions (Addendum)
Please do not take ibuprofen or naproxen with the mobic.  You can take tylenol Please be mindful of sedation with this medication (cyclobenzaprine).  Do not operate heavy machinery with this medication.   Please make sure you do three exercises three times per day for the neck and low back.  After the stretches, please follow it up with icing.  Pick three pictures of the neck and low back.  You will do the stretches three times per day, thus icing three times per day.    Low Back Strain Rehab Ask your health care provider which exercises are safe for you. Do exercises exactly as told by your health care provider and adjust them as directed. It is normal to feel mild stretching, pulling, tightness, or discomfort as you do these exercises, but you should stop right away if you feel sudden pain or your pain gets worse. Do not begin these exercises until told by your health care provider. Stretching and range of motion exercises These exercises warm up your muscles and joints and improve the movement and flexibility of your back. These exercises also help to relieve pain, numbness, and tingling. Exercise A: Single knee to chest  1. Lie on your back on a firm surface with both legs straight. 2. Bend one of your knees. Use your hands to move your knee up toward your chest until you feel a gentle stretch in your lower back and buttock. ? Hold your leg in this position by holding onto the front of your knee. ? Keep your other leg as straight as possible. 3. Hold for __________ seconds. 4. Slowly return to the starting position. 5. Repeat with your other leg. Repeat __________ times. Complete this exercise __________ times a day. Exercise B: Prone extension on elbows  1. Lie on your abdomen on a firm surface. 2. Prop yourself up on your elbows. 3. Use your arms to help lift your chest up until you feel a gentle stretch in your abdomen and your lower back. ? This will place some of your body weight on  your elbows. If this is uncomfortable, try stacking pillows under your chest. ? Your hips should stay down, against the surface that you are lying on. Keep your hip and back muscles relaxed. 4. Hold for __________ seconds. 5. Slowly relax your upper body and return to the starting position. Repeat __________ times. Complete this exercise __________ times a day. Strengthening exercises These exercises build strength and endurance in your back. Endurance is the ability to use your muscles for a long time, even after they get tired. Exercise C: Pelvic tilt 1. Lie on your back on a firm surface. Bend your knees and keep your feet flat. 2. Tense your abdominal muscles. Tip your pelvis up toward the ceiling and flatten your lower back into the floor. ? To help with this exercise, you may place a small towel under your lower back and try to push your back into the towel. 3. Hold for __________ seconds. 4. Let your muscles relax completely before you repeat this exercise. Repeat __________ times. Complete this exercise __________ times a day. Exercise D: Alternating arm and leg raises  1. Get on your hands and knees on a firm surface. If you are on a hard floor, you may want to use padding to cushion your knees, such as an exercise mat. 2. Line up your arms and legs. Your hands should be below your shoulders, and your knees should be below your hips. 3.  Lift your left leg behind you. At the same time, raise your right arm and straighten it in front of you. ? Do not lift your leg higher than your hip. ? Do not lift your arm higher than your shoulder. ? Keep your abdominal and back muscles tight. ? Keep your hips facing the ground. ? Do not arch your back. ? Keep your balance carefully, and do not hold your breath. 4. Hold for __________ seconds. 5. Slowly return to the starting position and repeat with your right leg and your left arm. Repeat __________ times. Complete this exercise __________times  a day. Exercise J: Single leg lower with bent knees 1. Lie on your back on a firm surface. 2. Tense your abdominal muscles and lift your feet off the floor, one foot at a time, so your knees and hips are bent in an "L" shape (at about 90 degrees). ? Your knees should be over your hips and your lower legs should be parallel to the floor. 3. Keeping your abdominal muscles tense and your knee bent, slowly lower one of your legs so your toe touches the ground. 4. Lift your leg back up to return to the starting position. ? Do not hold your breath. ? Do not let your back arch. Keep your back flat against the ground. 5. Repeat with your other leg. Repeat __________ times. Complete this exercise __________ times a day. Posture and body mechanics  Body mechanics refers to the movements and positions of your body while you do your daily activities. Posture is part of body mechanics. Good posture and healthy body mechanics can help to relieve stress in your body's tissues and joints. Good posture means that your spine is in its natural S-curve position (your spine is neutral), your shoulders are pulled back slightly, and your head is not tipped forward. The following are general guidelines for applying improved posture and body mechanics to your everyday activities. Standing   When standing, keep your spine neutral and your feet about hip-width apart. Keep a slight bend in your knees. Your ears, shoulders, and hips should line up.  When you do a task in which you stand in one place for a long time, place one foot up on a stable object that is 2-4 inches (5-10 cm) high, such as a footstool. This helps keep your spine neutral. Sitting   When sitting, keep your spine neutral and keep your feet flat on the floor. Use a footrest, if necessary, and keep your thighs parallel to the floor. Avoid rounding your shoulders, and avoid tilting your head forward.  When working at a desk or a computer, keep your desk  at a height where your hands are slightly lower than your elbows. Slide your chair under your desk so you are close enough to maintain good posture.  When working at a computer, place your monitor at a height where you are looking straight ahead and you do not have to tilt your head forward or downward to look at the screen. Resting   When lying down and resting, avoid positions that are most painful for you.  If you have pain with activities such as sitting, bending, stooping, or squatting (flexion-based activities), lie in a position in which your body does not bend very much. For example, avoid curling up on your side with your arms and knees near your chest (fetal position).  If you have pain with activities such as standing for a long time or reaching with your  arms (extension-based activities), lie with your spine in a neutral position and bend your knees slightly. Try the following positions: ? Lying on your side with a pillow between your knees. ? Lying on your back with a pillow under your knees. Lifting   When lifting objects, keep your feet at least shoulder-width apart and tighten your abdominal muscles.  Bend your knees and hips and keep your spine neutral. It is important to lift using the strength of your legs, not your back. Do not lock your knees straight out.  Always ask for help to lift heavy or awkward objects. This information is not intended to replace advice given to you by your health care provider. Make sure you discuss any questions you have with your health care provider. Document Released: 06/08/2005 Document Revised: 02/13/2016 Document Reviewed: 03/20/2015 Elsevier Interactive Patient Education  2018 [employer].     IF you received an x-ray today, you will receive an invoice from [facility]. Please contact [facility] at [phone] with questions or concerns regarding your invoice.   IF you received labwork today, you will receive an  invoice from [facility]. Please contact [facility] at [phone] with questions or concerns regarding your invoice.   Our billing staff will not be able to assist you with questions regarding bills from these companies.  You will be contacted with the lab results as soon as they are available. The fastest way to get your results is to activate your My Chart account. Instructions are located on the last page of this paperwork. If you have not heard from [place] regarding the results in 2 weeks, please contact this office.

## 2017-08-19 ENCOUNTER — Telehealth: Payer: Self-pay | Admitting: Physician Assistant

## 2017-08-19 ENCOUNTER — Other Ambulatory Visit: Payer: Self-pay | Admitting: Physician Assistant

## 2017-08-19 DIAGNOSIS — M549 Dorsalgia, unspecified: Secondary | ICD-10-CM

## 2017-08-19 MED ORDER — CYCLOBENZAPRINE HCL 10 MG PO TABS: 5.0000 mg | ORAL_TABLET | Freq: Three times a day (TID) | ORAL | 0 refills | Status: DC | PRN

## 2017-08-19 NOTE — Telephone Encounter (Signed)
Copied from [place]. Topic: Quick Communication - Rx Refill/Question >> Aug 19, 2017  2:17 PM Veronica Holt wrote: Per OV 08/11/17 pt was to have mobic and cyclobenzaprine sent into her pharmacy. She went to pick it up and they do not have any orders. Please send in asap.  [facility] [address] [place] [phone] (Phone) [phone] (Fax)

## 2017-08-20 MED ORDER — MELOXICAM 7.5 MG PO TABS: 7.5000 mg | ORAL_TABLET | Freq: Every day | ORAL | 0 refills | Status: DC

## 2017-08-21 NOTE — Telephone Encounter (Signed)
Rx sent by Ivar Drape PA

## 2017-09-21 ENCOUNTER — Encounter: Payer: Self-pay | Admitting: Physician Assistant

## 2018-03-21 DIAGNOSIS — I10 Essential (primary) hypertension: Secondary | ICD-10-CM | POA: Diagnosis not present

## 2018-03-21 DIAGNOSIS — M549 Dorsalgia, unspecified: Secondary | ICD-10-CM | POA: Diagnosis not present

## 2018-03-23 DIAGNOSIS — I10 Essential (primary) hypertension: Secondary | ICD-10-CM | POA: Diagnosis not present

## 2018-04-12 DIAGNOSIS — N898 Other specified noninflammatory disorders of vagina: Secondary | ICD-10-CM | POA: Diagnosis not present

## 2018-04-20 ENCOUNTER — Ambulatory Visit: Payer: Self-pay | Admitting: Family Medicine

## 2018-05-20 ENCOUNTER — Telehealth: Payer: Self-pay | Admitting: Family Medicine

## 2018-05-20 NOTE — Telephone Encounter (Signed)
LVM for pt to call the office and reschedule their appt on 07/14/18 with Dr. Nolon Rod. When pt calls back, please schedule at their convenience.

## 2018-05-28 ENCOUNTER — Other Ambulatory Visit: Payer: Self-pay

## 2018-05-28 ENCOUNTER — Encounter: Payer: Self-pay | Admitting: Family Medicine

## 2018-05-28 ENCOUNTER — Ambulatory Visit (INDEPENDENT_AMBULATORY_CARE_PROVIDER_SITE_OTHER): Payer: 59 | Admitting: Family Medicine

## 2018-05-28 VITALS — BP 150/82 | HR 81 | Temp 98.0°F | Resp 16 | Ht 65.35 in | Wt 202.0 lb

## 2018-05-28 DIAGNOSIS — H1011 Acute atopic conjunctivitis, right eye: Secondary | ICD-10-CM | POA: Diagnosis not present

## 2018-05-28 DIAGNOSIS — H5789 Other specified disorders of eye and adnexa: Secondary | ICD-10-CM | POA: Diagnosis not present

## 2018-05-28 DIAGNOSIS — H43393 Other vitreous opacities, bilateral: Secondary | ICD-10-CM | POA: Diagnosis not present

## 2018-05-28 MED ORDER — OLOPATADINE HCL 0.2 % OP SOLN: 1.0000 [drp] | Freq: Every day | OPHTHALMIC | 0 refills | Status: AC

## 2018-05-28 NOTE — Progress Notes (Signed)
Chief Complaint  Patient presents with  . Eye Pain    pt states she has noticed drainage,redness, and itching x 4 days     HPI  Patient reports that for 4 days now she has been having watery eyes with redness. No purulent drainage. No eye pain. No blurry vision or decrease in seeing the letters or street signs. For a month now she has been having floaters in her visual field.  She came in today because she started having symptoms after using the leaf blower to clean up the yard. She was wearing goggles but they got foggy so she took them off. She takes zyrtec daily. She denies ear pain or cough.     History reviewed. No pertinent past medical history.  Current Outpatient Medications  Medication Sig Dispense Refill  . amLODipine (NORVASC) 10 MG tablet Take 10 mg by mouth daily.    . carvedilol (COREG) 3.125 MG tablet Take 3.125 mg by mouth 2 (two) times daily with a meal.    . furosemide (LASIX) 20 MG tablet Take 20 mg by mouth.    . hydrochlorothiazide (HYDRODIURIL) 25 MG tablet Take 25 mg by mouth daily.    . Olopatadine HCl 0.2 % SOLN Apply 1 drop to eye daily. 2.5 mL 0   No current facility-administered medications for this visit.     Allergies: No Known Allergies  History reviewed. No pertinent surgical history.  Social History   Socioeconomic History  . Marital status: Single    Spouse name: Not on file  . Number of children: Not on file  . Years of education: Not on file  . Highest education level: Not on file  Occupational History  . Not on file  Social Needs  . Financial resource strain: Not on file  . Food insecurity:    Worry: Not on file    Inability: Not on file  . Transportation needs:    Medical: Not on file    Non-medical: Not on file  Tobacco Use  . Smoking status: Former Smoker    Last attempt to quit: 01/14/2013    Years since quitting: 5.3  . Smokeless tobacco: Never Used  Substance and Sexual Activity  . Alcohol use: No  . Drug use: No  .  Sexual activity: Yes  Lifestyle  . Physical activity:    Days per week: Not on file    Minutes per session: Not on file  . Stress: Not on file  Relationships  . Social connections:    Talks on phone: Not on file    Gets together: Not on file    Attends religious service: Not on file    Active member of club or organization: Not on file    Attends meetings of clubs or organizations: Not on file    Relationship status: Not on file  Other Topics Concern  . Not on file  Social History Narrative  . Not on file    History reviewed. No pertinent family history.   ROS Review of Systems See HPI [place]: No fevers or chills No malaise No diaphoresis Skin: No rash or itching Eyes: see hpi GU: no dysuria or hematuria Neuro: no dizziness or headaches all others reviewed and negative   Objective: Vitals:   05/28/18 1236  BP: (!) 150/82  Pulse: 81  Resp: 16  Temp: 98 F (36.7 C)  TempSrc: Oral  SpO2: 99%  Weight: 202 lb (91.6 kg)  Height: 5' 5.35" (1.66 m)  Physical Exam General: alert, oriented, in NAD Head: normocephalic, atraumatic, no sinus tenderness Eyes: EOM intact, no scleral icterus or conjunctival with mild injection Ears: TM clear bilaterally, bulging  Nose: mucosa nonerythematous, nonedematous Throat: no pharyngeal exudate or erythema Lymph: no posterior auricular, submental or cervical lymph adenopathy Heart: normal rate, normal sinus rhythm, no murmurs Lungs: clear to auscultation bilaterally, no wheezing   Assessment and Plan Makaylen was seen today for eye pain.  Diagnoses and all orders for this visit:  Irritation of right eye -     Ambulatory referral to Ophthalmology  Floaters in visual field, bilateral -     Ambulatory referral to Ophthalmology  Allergic conjunctivitis of right eye  Other orders -     Olopatadine HCl 0.2 % SOLN; Apply 1 drop to eye daily.   Advised to change her mascara Follow up with eye doctor to get a full  evaluation for her floaters and vision changes pataday for the eyes for possible allergic conjunctivitis and claritin PO   Donnis Pecha A Talia Hoheisel

## 2018-05-28 NOTE — Patient Instructions (Addendum)
If you have lab work done today you will be contacted with your lab results within the next 2 weeks.  If you have not heard from [place] then please contact us. The fastest way to get your results is to register for My Chart.   IF you received an x-ray today, you will receive an invoice from [facility]. Please contact [facility] at [phone] with questions or concerns regarding your invoice.   IF you received labwork today, you will receive an invoice from [facility]. Please contact [facility] at [phone] with questions or concerns regarding your invoice.   Our billing staff will not be able to assist you with questions regarding bills from these companies.  You will be contacted with the lab results as soon as they are available. The fastest way to get your results is to activate your My Chart account. Instructions are located on the last page of this paperwork. If you have not heard from [place] regarding the results in 2 weeks, please contact this office.      Allergic Conjunctivitis, Adult Allergic conjunctivitis is inflammation of the clear membrane that covers the white part of your eye and the inner surface of your eyelid (conjunctiva). The inflammation is caused by allergies. The blood vessels in the conjunctiva become inflamed and this causes the eyes to become red or pink. The eyes often feel itchy. Allergic conjunctivitis cannot be spread from one person to another person (is not contagious). What are the causes? This condition is caused by an allergic reaction. Common causes of an allergic reaction (allergens) include:  Outdoor allergens, such as: ? Pollen. ? Grass and weeds. ? Mold spores.  Indoor allergens, such as: ? Dust. ? Smoke. ? Mold. ? Pet dander. ? Animal hair.  What increases the risk? You may be more likely to develop this condition if you have a family history of allergies, such as:  Allergic rhinitis.  Bronchial asthma.  Atopic  dermatitis.  What are the signs or symptoms? Symptoms of this condition include eyes that are:  Itchy.  Red.  Watery.  Puffy.  Your eyes may also:  Sting or burn.  Have clear drainage coming from them.  How is this diagnosed? This condition may be diagnosed by medical history and physical exam. If you have drainage from your eyes, it may be tested to rule out other causes of conjunctivitis. You may also need to see a health care provider who specializes in treating allergies (allergist) or eye conditions (ophthalmologist) for tests to confirm the diagnosis. You may have:  Skin tests to see which allergens are causing your symptoms. These tests involve pricking the skin with a tiny needle and exposing the skin to small amounts of potential allergens to see if your skin reacts.  Blood tests.  Tissue scrapings from your eyelid. These will be examined under a microscope.  How is this treated? Treatments for this condition may include:  Cold cloths (compresses) to soothe itching and swelling.  Washing the face to remove allergens.  Eye drops. These may be prescription or over-the-counter. There are several different types. You may need to try different types to see which one works best for you. Your may need: ? Eye drops that block the allergic reaction (antihistamine). ? Eye drops that reduce swelling and irritation (anti-inflammatory). ? Steroid eye drops to lessen a severe reaction (vernal conjunctivitis).  Oral antihistamine medicines to reduce your allergic reaction. You may need these if eye drops do not help  or are difficult to use.  Follow these instructions at home:  Avoid known allergens whenever possible.  Take or apply over-the-counter and prescription medicines only as told by your health care provider. These include any eye drops.  Apply a cool, clean washcloth to your eye for 10-20 minutes, 3-4 times a day.  Do not touch or rub your eyes.  Do not wear  contact lenses until the inflammation is gone. Wear glasses instead.  Do not wear eye makeup until the inflammation is gone.  Keep all follow-up visits as told by your health care provider. This is important. Contact a health care provider if:  Your symptoms get worse or do not improve with treatment.  You have mild eye pain.  You have sensitivity to light.  You have spots or blisters on your eyes.  You have pus draining from your eye.  You have a fever. Get help right away if:  You have redness, swelling, or other symptoms in only one eye.  Your vision is blurred or you have vision changes.  You have severe eye pain. This information is not intended to replace advice given to you by your health care provider. Make sure you discuss any questions you have with your health care provider. Document Released: 08/29/2002 Document Revised: 02/05/2016 Document Reviewed: 12/20/2015 Elsevier Interactive Patient Education  2018 [employer].

## 2018-05-29 DIAGNOSIS — H43393 Other vitreous opacities, bilateral: Secondary | ICD-10-CM | POA: Insufficient documentation

## 2018-06-21 ENCOUNTER — Ambulatory Visit: Payer: 59 | Admitting: Emergency Medicine

## 2018-06-21 ENCOUNTER — Other Ambulatory Visit: Payer: Self-pay

## 2018-06-21 ENCOUNTER — Encounter: Payer: Self-pay | Admitting: Emergency Medicine

## 2018-06-21 ENCOUNTER — Ambulatory Visit (INDEPENDENT_AMBULATORY_CARE_PROVIDER_SITE_OTHER): Payer: 59 | Admitting: Emergency Medicine

## 2018-06-21 VITALS — BP 166/119 | HR 93 | Temp 99.2°F | Resp 16 | Ht 65.35 in | Wt 200.0 lb

## 2018-06-21 DIAGNOSIS — H60502 Unspecified acute noninfective otitis externa, left ear: Secondary | ICD-10-CM

## 2018-06-21 DIAGNOSIS — H9202 Otalgia, left ear: Secondary | ICD-10-CM | POA: Diagnosis not present

## 2018-06-21 MED ORDER — AMOXICILLIN-POT CLAVULANATE 875-125 MG PO TABS: 1.0000 | ORAL_TABLET | Freq: Two times a day (BID) | ORAL | 0 refills | Status: AC

## 2018-06-21 MED ORDER — TRAMADOL HCL 50 MG PO TABS: 50.0000 mg | ORAL_TABLET | Freq: Three times a day (TID) | ORAL | 0 refills | Status: AC | PRN

## 2018-06-21 MED ORDER — HYDROCORTISONE-ACETIC ACID 1-2 % OT SOLN: 3.0000 [drp] | Freq: Three times a day (TID) | OTIC | 1 refills | Status: DC

## 2018-06-21 NOTE — Patient Instructions (Addendum)
   If you have lab work done today you will be contacted with your lab results within the next 2 weeks.  If you have not heard from us then please contact us. The fastest way to get your results is to register for My Chart.   IF you received an x-ray today, you will receive an invoice from [facility]. Please contact [facility] at [phone] with questions or concerns regarding your invoice.   IF you received labwork today, you will receive an invoice from [facility]. Please contact [facility] at [phone] with questions or concerns regarding your invoice.   Our billing staff will not be able to assist you with questions regarding bills from these companies.  You will be contacted with the lab results as soon as they are available. The fastest way to get your results is to activate your My Chart account. Instructions are located on the last page of this paperwork. If you have not heard from us regarding the results in 2 weeks, please contact this office.     Otitis Externa  Otitis externa is an infection of the outer ear canal. The outer ear canal is the area between the outside of the ear and the eardrum. Otitis externa is sometimes called swimmer's ear. What are the causes? Common causes of this condition include:  Swimming in dirty water.  Moisture in the ear.  An injury to the inside of the ear.  An object stuck in the ear.  A cut or scrape on the outside of the ear. What increases the risk? You are more likely to get this condition if you go swimming often. What are the signs or symptoms?  Itching in the ear. This is often the first symptom.  Swelling of the ear.  Redness in the ear.  Ear pain. The pain may get worse when you pull on your ear.  Pus coming from the ear. How is this treated? This condition may be treated with:  Antibiotic ear drops. These are often given for 10-14 days.  Medicines to reduce itching and swelling. Follow these  instructions at home:  If you were given antibiotic ear drops, use them as told by your doctor. Do not stop using them even if your condition gets better.  Take over-the-counter and prescription medicines only as told by your doctor.  Avoid getting water in your ears as told by your doctor. You may be told to avoid swimming or water sports for a few days.  Keep all follow-up visits as told by your doctor. This is important. How is this prevented?  Keep your ears dry. Use the corner of a towel to dry your ears after you swim or bathe.  Try not to scratch or put things in your ear. Doing these things makes it easier for germs to grow in your ear.  Avoid swimming in lakes, dirty water, or pools that may not have the right amount of a chemical called chlorine. Contact a doctor if:  You have a fever.  Your ear is still red, swollen, or painful after 3 days.  You still have pus coming from your ear after 3 days.  Your redness, swelling, or pain gets worse.  You have a really bad headache.  You have redness, swelling, pain, or tenderness behind your ear. Summary  Otitis externa is an infection of the outer ear canal.  Symptoms include pain, redness, and swelling of the ear.  If you were given antibiotic ear drops, use them   as told by your doctor. Do not stop using them even if your condition gets better.  Try not to scratch or put things in your ear. This information is not intended to replace advice given to you by your health care provider. Make sure you discuss any questions you have with your health care provider. Document Released: 11/25/2007 Document Revised: 11/12/2017 Document Reviewed: 11/12/2017 Elsevier Interactive Patient Education  2019 [employer].

## 2018-06-21 NOTE — Progress Notes (Signed)
Veronica Holt 42 y.o.   Chief Complaint  Patient presents with  . left ear pain x 06/15/18    HISTORY OF PRESENT ILLNESS: This is a 42 y.o. female complaining of left ear pain that started 1 week ago.  No other significant symptoms. Has a history of hypertension and has not taken her medications today.  HPI   Prior to Admission medications   Medication Sig Start Date End Date Taking? Authorizing Provider  amLODipine (NORVASC) 10 MG tablet Take 10 mg by mouth daily.   Yes [provider]  carvedilol (COREG) 3.125 MG tablet Take 3.125 mg by mouth 2 (two) times daily with a meal.   Yes [provider]  furosemide (LASIX) 20 MG tablet Take 20 mg by mouth.   Yes [provider]  hydrochlorothiazide (HYDRODIURIL) 25 MG tablet Take 25 mg by mouth daily.   Yes [provider]  Olopatadine HCl 0.2 % SOLN Apply 1 drop to eye daily. 05/28/18  Yes Forrest Moron, MD    No Known Allergies  Patient Active Problem List   Diagnosis Date Noted  . Floaters in visual field, bilateral 05/29/2018    No past medical history on file.  No past surgical history on file.  Social History   Socioeconomic History  . Marital status: Single    Spouse name: Not on file  . Number of children: Not on file  . Years of education: Not on file  . Highest education level: Not on file  Occupational History  . Not on file  Social Needs  . Financial resource strain: Not on file  . Food insecurity:    Worry: Not on file    Inability: Not on file  . Transportation needs:    Medical: Not on file    Non-medical: Not on file  Tobacco Use  . Smoking status: Former Smoker    Last attempt to quit: 01/14/2013    Years since quitting: 5.4  . Smokeless tobacco: Never Used  Substance and Sexual Activity  . Alcohol use: No  . Drug use: No  . Sexual activity: Yes  Lifestyle  . Physical activity:    Days per week: Not on file    Minutes per session: Not on file  .  Stress: Not on file  Relationships  . Social connections:    Talks on phone: Not on file    Gets together: Not on file    Attends religious service: Not on file    Active member of club or organization: Not on file    Attends meetings of clubs or organizations: Not on file    Relationship status: Not on file  . Intimate partner violence:    Fear of current or ex partner: Not on file    Emotionally abused: Not on file    Physically abused: Not on file    Forced sexual activity: Not on file  Other Topics Concern  . Not on file  Social History Narrative  . Not on file    No family history on file.   Review of Systems  Constitutional: Negative for chills and fever.  HENT: Positive for ear pain.   Eyes: Negative.   Respiratory: Negative.   Cardiovascular: Negative.   Gastrointestinal: Negative for nausea and vomiting.  Skin: Negative.   Neurological: Negative for dizziness and headaches.  All other systems reviewed and are negative.   Vitals:   06/21/18 1723  BP: (!) 166/119  Pulse: 93  Resp:  16  Temp: 99.2 F (37.3 C)  SpO2: 99%    Physical Exam Vitals signs reviewed.  Constitutional:      Appearance: Normal appearance.  HENT:     Head: Normocephalic and atraumatic.     Comments: Left ear: Hyperemic and tender canal.  Normal tympanic membrane.    Nose: Nose normal.     Mouth/Throat:     Mouth: Mucous membranes are moist.     Pharynx: Oropharynx is clear.  Eyes:     Extraocular Movements: Extraocular movements intact.     Conjunctiva/sclera: Conjunctivae normal.     Pupils: Pupils are equal, round, and reactive to light.  Neck:     Musculoskeletal: Normal range of motion and neck supple. No muscular tenderness.  Cardiovascular:     Rate and Rhythm: Normal rate and regular rhythm.     Heart sounds: Normal heart sounds.  Pulmonary:     Effort: Pulmonary effort is normal.     Breath sounds: Normal breath sounds.  Musculoskeletal: Normal range of motion.    Lymphadenopathy:     Cervical: No cervical adenopathy.  Skin:    General: Skin is warm and dry.     Capillary Refill: Capillary refill takes less than 2 seconds.  Neurological:     General: No focal deficit present.     Mental Status: She is alert and oriented to person, place, and time.  Psychiatric:        Mood and Affect: Mood normal.        Behavior: Behavior normal.      ASSESSMENT & PLAN: [name] was seen today for left ear pain x 06/15/18.  Diagnoses and all orders for this visit:  Acute otalgia, left -     traMADol (ULTRAM) 50 MG tablet; Take 1 tablet (50 mg total) by mouth every 8 (eight) hours as needed.  Acute otitis externa of left ear, unspecified type -     acetic acid-hydrocortisone (VOSOL-HC) OTIC solution; Place 3 drops into the left ear 3 (three) times daily. -     amoxicillin-clavulanate (AUGMENTIN) 875-125 MG tablet; Take 1 tablet by mouth 2 (two) times daily for 7 days.    Patient Instructions       If you have lab work done today you will be contacted with your lab results within the next 2 weeks.  If you have not heard from [place] then please contact us. The fastest way to get your results is to register for My Chart.   IF you received an x-ray today, you will receive an invoice from [facility]. Please contact [facility] at [phone] with questions or concerns regarding your invoice.   IF you received labwork today, you will receive an invoice from [facility]. Please contact [facility] at [phone] with questions or concerns regarding your invoice.   Our billing staff will not be able to assist you with questions regarding bills from these companies.  You will be contacted with the lab results as soon as they are available. The fastest way to get your results is to activate your My Chart account. Instructions are located on the last page of this paperwork. If you have not heard from [place] regarding the results in 2 weeks, please  contact this office.     Otitis Externa  Otitis externa is an infection of the outer ear canal. The outer ear canal is the area between the outside of the ear and the eardrum. Otitis externa is sometimes called swimmer's ear. What are  the causes? Common causes of this condition include:  Swimming in dirty water.  Moisture in the ear.  An injury to the inside of the ear.  An object stuck in the ear.  A cut or scrape on the outside of the ear. What increases the risk? You are more likely to get this condition if you go swimming often. What are the signs or symptoms?  Itching in the ear. This is often the first symptom.  Swelling of the ear.  Redness in the ear.  Ear pain. The pain may get worse when you pull on your ear.  Pus coming from the ear. How is this treated? This condition may be treated with:  Antibiotic ear drops. These are often given for 10-14 days.  Medicines to reduce itching and swelling. Follow these instructions at home:  If you were given antibiotic ear drops, use them as told by your doctor. Do not stop using them even if your condition gets better.  Take over-the-counter and prescription medicines only as told by your doctor.  Avoid getting water in your ears as told by your doctor. You may be told to avoid swimming or water sports for a few days.  Keep all follow-up visits as told by your doctor. This is important. How is this prevented?  Keep your ears dry. Use the corner of a towel to dry your ears after you swim or bathe.  Try not to scratch or put things in your ear. Doing these things makes it easier for germs to grow in your ear.  Avoid swimming in lakes, dirty water, or pools that may not have the right amount of a chemical called chlorine. Contact a doctor if:  You have a fever.  Your ear is still red, swollen, or painful after 3 days.  You still have pus coming from your ear after 3 days.  Your redness, swelling, or pain gets  worse.  You have a really bad headache.  You have redness, swelling, pain, or tenderness behind your ear. Summary  Otitis externa is an infection of the outer ear canal.  Symptoms include pain, redness, and swelling of the ear.  If you were given antibiotic ear drops, use them as told by your doctor. Do not stop using them even if your condition gets better.  Try not to scratch or put things in your ear. This information is not intended to replace advice given to you by your health care provider. Make sure you discuss any questions you have with your health care provider. Document Released: 11/25/2007 Document Revised: 11/12/2017 Document Reviewed: 11/12/2017 Elsevier Interactive Patient Education  2019 Elsevier Inc.      Agustina Caroli, MD [facility]

## 2018-06-22 ENCOUNTER — Encounter: Payer: Self-pay | Admitting: Emergency Medicine

## 2018-06-22 DIAGNOSIS — H60502 Unspecified acute noninfective otitis externa, left ear: Secondary | ICD-10-CM | POA: Insufficient documentation

## 2018-06-22 DIAGNOSIS — H9202 Otalgia, left ear: Secondary | ICD-10-CM | POA: Insufficient documentation

## 2018-06-28 ENCOUNTER — Ambulatory Visit: Payer: Self-pay

## 2018-07-14 ENCOUNTER — Ambulatory Visit: Payer: Self-pay | Admitting: Family Medicine

## 2018-07-14 ENCOUNTER — Encounter

## 2018-08-22 DIAGNOSIS — Z1349 Encounter for screening for other developmental delays: Secondary | ICD-10-CM | POA: Diagnosis not present

## 2018-08-22 DIAGNOSIS — Z1231 Encounter for screening mammogram for malignant neoplasm of breast: Secondary | ICD-10-CM | POA: Diagnosis not present

## 2018-08-22 DIAGNOSIS — Z118 Encounter for screening for other infectious and parasitic diseases: Secondary | ICD-10-CM | POA: Diagnosis not present

## 2018-08-22 DIAGNOSIS — Z1159 Encounter for screening for other viral diseases: Secondary | ICD-10-CM | POA: Diagnosis not present

## 2018-08-22 DIAGNOSIS — Z3149 Encounter for other procreative investigation and testing: Secondary | ICD-10-CM | POA: Diagnosis not present

## 2018-08-22 DIAGNOSIS — Z01419 Encounter for gynecological examination (general) (routine) without abnormal findings: Secondary | ICD-10-CM | POA: Diagnosis not present

## 2018-08-22 DIAGNOSIS — B373 Candidiasis of vulva and vagina: Secondary | ICD-10-CM | POA: Diagnosis not present

## 2018-08-22 DIAGNOSIS — R1013 Epigastric pain: Secondary | ICD-10-CM | POA: Diagnosis not present

## 2018-08-22 DIAGNOSIS — Z6834 Body mass index (BMI) 34.0-34.9, adult: Secondary | ICD-10-CM | POA: Diagnosis not present

## 2018-09-02 DIAGNOSIS — Z3169 Encounter for other general counseling and advice on procreation: Secondary | ICD-10-CM | POA: Diagnosis not present

## 2018-09-20 DIAGNOSIS — Z3149 Encounter for other procreative investigation and testing: Secondary | ICD-10-CM | POA: Diagnosis not present

## 2018-09-21 DIAGNOSIS — Z3149 Encounter for other procreative investigation and testing: Secondary | ICD-10-CM | POA: Diagnosis not present

## 2018-09-21 DIAGNOSIS — Z3169 Encounter for other general counseling and advice on procreation: Secondary | ICD-10-CM | POA: Diagnosis not present

## 2018-09-21 DIAGNOSIS — D259 Leiomyoma of uterus, unspecified: Secondary | ICD-10-CM | POA: Diagnosis not present

## 2018-09-30 ENCOUNTER — Telehealth (INDEPENDENT_AMBULATORY_CARE_PROVIDER_SITE_OTHER): Payer: 59 | Admitting: Family Medicine

## 2018-09-30 ENCOUNTER — Other Ambulatory Visit: Payer: Self-pay

## 2018-09-30 DIAGNOSIS — J302 Other seasonal allergic rhinitis: Secondary | ICD-10-CM

## 2018-09-30 DIAGNOSIS — J0100 Acute maxillary sinusitis, unspecified: Secondary | ICD-10-CM

## 2018-09-30 MED ORDER — FLUTICASONE PROPIONATE 50 MCG/ACT NA SUSP: 1.0000 | Freq: Two times a day (BID) | NASAL | 6 refills | Status: AC

## 2018-09-30 MED ORDER — OLOPATADINE HCL 0.1 % OP SOLN: 1.0000 [drp] | Freq: Two times a day (BID) | OPHTHALMIC | 12 refills | Status: AC

## 2018-09-30 MED ORDER — AMOXICILLIN-POT CLAVULANATE 875-125 MG PO TABS: 1.0000 | ORAL_TABLET | Freq: Two times a day (BID) | ORAL | 0 refills | Status: AC

## 2018-09-30 NOTE — Progress Notes (Signed)
Virtual Visit via telephone Note  I connected with patient on 09/30/18 at 1047am by telephone and verified that I am speaking with the correct person using two identifiers. Veronica Holt is currently located at home and patient is currently with her during visit. The provider, Rutherford Guys, MD is located in their office at time of visit.  I discussed the limitations, risks, security and privacy concerns of performing an evaluation and management service by telephone and the availability of in person appointments. I also discussed with the patient that there may be a patient responsible charge related to this service. The patient expressed understanding and agreed to proceed.  Telephone visit today for allergies  HPI  43yo Female with PMH of HTN and seasonal allergies  Having sneezing, nasal congestion, swollen eyes, mostly Left side x 1 week Mucous yellowish greenish color Having pressure on left side of face Taking benadryl which is not helping Had to leave week once due to this Has been having sweats and chills Since staying at home a bit better Having scratchy throat She very itchy, eyes, nose, face ?  Fall Risk  09/30/2018 06/21/2018 05/28/2018 08/11/2017 02/24/2017  Falls in the past year? 0 0 0 No No  Number falls in past yr: 0 - 0 - -  Injury with Fall? 0 - 0 - -     Depression screen [facility] 2/9 09/30/2018 06/21/2018 08/11/2017  Decreased Interest 0 0 0  Down, Depressed, Hopeless 0 0 0  PHQ - 2 Score 0 0 0    No Known Allergies  Prior to Admission medications   Medication Sig Start Date End Date Taking? Authorizing Provider  amLODipine (NORVASC) 10 MG tablet Take 10 mg by mouth daily.   Yes [provider]  carvedilol (COREG) 3.125 MG tablet Take 3.125 mg by mouth 2 (two) times daily with a meal.   Yes [provider]  hydrochlorothiazide (HYDRODIURIL) 25 MG tablet Take 25 mg by mouth daily.   Yes [provider]  Olopatadine HCl 0.2 % SOLN  Apply 1 drop to eye daily. 05/28/18  Yes Forrest Moron, MD  traMADol (ULTRAM) 50 MG tablet Take 1 tablet (50 mg total) by mouth every 8 (eight) hours as needed. 06/21/18  Yes Sagardia, Ines Bloomer, MD  furosemide (LASIX) 20 MG tablet Take 20 mg by mouth.    [provider]    No past medical history on file.  No past surgical history on file.  Social History   Tobacco Use  . Smoking status: Former Smoker    Last attempt to quit: 01/14/2013    Years since quitting: 5.7  . Smokeless tobacco: Never Used  Substance Use Topics  . Alcohol use: No    No family history on file.  ROS Per hpi  Objective  Vitals as reported by the patient: none  There were no vitals filed for this visit.  ASSESSMENT and PLAN  1. Acute non-recurrent maxillary sinusitis 2. Seasonal allergies Discussed supportive measures, new meds r/se/b and RTC precautions.  Other orders - fluticasone (FLONASE) 50 MCG/ACT nasal spray; Place 1 spray into both nostrils 2 (two) times daily. - olopatadine (PATANOL) 0.1 % ophthalmic solution; Place 1 drop into both eyes 2 (two) times daily. - amoxicillin-clavulanate (AUGMENTIN) 875-125 MG tablet; Take 1 tablet by mouth 2 (two) times daily.  FOLLOW-UP: prn   The above assessment and management plan was discussed with the patient. The patient verbalized understanding of and has agreed to the  management plan. Patient is aware to call the clinic if symptoms persist or worsen. Patient is aware when to return to the clinic for a follow-up visit. Patient educated on when it is appropriate to go to the emergency department.    I provided 11 minutes of non-face-to-face time during this encounter.  Rutherford Guys, MD Primary [facility] at [facility], [place] [address] Ph.  [phone] Fax [phone]

## 2018-09-30 NOTE — Progress Notes (Signed)
   Allergies going on for a week.   Sneezing , cough, dry throat, and itchy eyes.

## 2018-10-10 ENCOUNTER — Telehealth: Payer: Self-pay | Admitting: Family Medicine

## 2018-10-10 NOTE — Telephone Encounter (Signed)
Please provide return to work note. thanks

## 2018-10-10 NOTE — Telephone Encounter (Signed)
Spoke with pt advised work note ready and pt would like for note to mailed to home address.  Advised will place in mail today.  Pt agreeable. Dgaddy, CMA

## 2018-10-10 NOTE — Telephone Encounter (Signed)
Copied from [place] [id number]. Topic: General - Inquiry >> Oct 10, 2018 10:58 AM Lionel December wrote: Reason for CRM: pt had a virtual visit on 09/30/18 for a sore throat. She needs a note to return to work.

## 2018-10-11 NOTE — Telephone Encounter (Signed)
° ° °  Pt would like to pick up the return note today because she need to go back to work 4.22.2020  Note as mail to her home address

## 2018-10-13 ENCOUNTER — Other Ambulatory Visit: Payer: Self-pay

## 2018-10-13 ENCOUNTER — Telehealth: Payer: 59 | Admitting: Family Medicine

## 2018-10-13 ENCOUNTER — Telehealth: Payer: Self-pay | Admitting: Family Medicine

## 2018-10-13 NOTE — Telephone Encounter (Signed)
10/13/2018 - PATIENT HAD A VIRTUAL VISIT SCHEDULED WITH DR. LISA CORUM ON Thursday (10/13/2018) FOR A SINUS INFECTION. SHE WAS A NO-SHOW. I TRIED TO CALL HER TO RESCHEDULE BUT HAD TO LEAVE HER A VOICE MAIL TO RETURN OUR CALL. [place]

## 2018-12-08 ENCOUNTER — Ambulatory Visit: Payer: 59

## 2018-12-08 ENCOUNTER — Other Ambulatory Visit: Payer: Self-pay

## 2018-12-12 ENCOUNTER — Ambulatory Visit (INDEPENDENT_AMBULATORY_CARE_PROVIDER_SITE_OTHER): Payer: 59 | Admitting: Family Medicine

## 2018-12-12 ENCOUNTER — Other Ambulatory Visit: Payer: Self-pay

## 2018-12-12 DIAGNOSIS — Z111 Encounter for screening for respiratory tuberculosis: Secondary | ICD-10-CM | POA: Diagnosis not present

## 2018-12-12 NOTE — Patient Instructions (Signed)

## 2018-12-14 ENCOUNTER — Ambulatory Visit (INDEPENDENT_AMBULATORY_CARE_PROVIDER_SITE_OTHER): Payer: 59 | Admitting: Family Medicine

## 2018-12-14 ENCOUNTER — Other Ambulatory Visit: Payer: Self-pay

## 2018-12-14 DIAGNOSIS — Z111 Encounter for screening for respiratory tuberculosis: Secondary | ICD-10-CM

## 2018-12-14 LAB — TB SKIN TEST
Induration: 0 mm
TB Skin Test: NEGATIVE

## 2019-03-07 ENCOUNTER — Ambulatory Visit: Payer: 59 | Admitting: Family Medicine

## 2019-03-08 ENCOUNTER — Encounter: Payer: Self-pay | Admitting: Family Medicine

## 2019-03-20 ENCOUNTER — Ambulatory Visit: Payer: 59 | Admitting: Family Medicine

## 2020-05-29 ENCOUNTER — Encounter (HOSPITAL_COMMUNITY): Payer: Self-pay | Admitting: Emergency Medicine

## 2020-05-29 ENCOUNTER — Other Ambulatory Visit: Payer: Self-pay

## 2020-05-29 ENCOUNTER — Emergency Department (HOSPITAL_COMMUNITY): Payer: Self-pay

## 2020-05-29 ENCOUNTER — Emergency Department (HOSPITAL_COMMUNITY)
Admission: EM | Admit: 2020-05-29 | Discharge: 2020-05-29 | Disposition: A | Discharge status: Home / Self Care | Payer: Self-pay | Attending: Emergency Medicine | Admitting: Emergency Medicine

## 2020-05-29 DIAGNOSIS — M25512 Pain in left shoulder: Secondary | ICD-10-CM | POA: Insufficient documentation

## 2020-05-29 DIAGNOSIS — Z79899 Other long term (current) drug therapy: Secondary | ICD-10-CM | POA: Insufficient documentation

## 2020-05-29 DIAGNOSIS — I10 Essential (primary) hypertension: Secondary | ICD-10-CM | POA: Insufficient documentation

## 2020-05-29 DIAGNOSIS — M542 Cervicalgia: Secondary | ICD-10-CM | POA: Insufficient documentation

## 2020-05-29 DIAGNOSIS — Z87891 Personal history of nicotine dependence: Secondary | ICD-10-CM | POA: Insufficient documentation

## 2020-05-29 MED ORDER — METHOCARBAMOL 500 MG PO TABS: 1000.0000 mg | ORAL_TABLET | Freq: Four times a day (QID) | ORAL | 0 refills | Status: AC

## 2020-05-29 MED ORDER — MELOXICAM 7.5 MG PO TABS: 7.5000 mg | ORAL_TABLET | Freq: Every day | ORAL | 0 refills | Status: AC

## 2020-05-29 NOTE — ED Triage Notes (Signed)
Pt reports left shoulder pain x2 months, reports worsening in the last two days.  Pt reports burning in shoulder that radiates up neck.   Pt reports no known injury to shoulder, no falls.

## 2020-05-29 NOTE — ED Provider Notes (Signed)
[facility] DEPT Provider Note   CSN: [id number] Arrival date & time: 05/29/20  1000     History Chief Complaint  Patient presents with  . Shoulder Pain    Veronica Holt is a 44 y.o. female.  Patient with history of hypertension presents to the emergency department for evaluation of left shoulder pain.  Patient reports doing a lot of lifting and movements at her job.  She has had pain in her left shoulder over the past 2 months.  She reports worsening pain since Thanksgiving and particularly in the past 2 days.  Pain is worse with movement.  It does not radiate.  Pain starts in the base of the neck and goes down the posterior shoulder into the front of the shoulder.  It is burning in nature.  No weakness in the arms or legs.  She has been applying topical Voltaren without improvement.  She also took 1 Goody powder.        Past Medical History:  Diagnosis Date  . Hypertension     Patient Active Problem List   Diagnosis Date Noted  . Acute otalgia, left 06/22/2018  . Acute otitis externa of left ear 06/22/2018  . Floaters in visual field, bilateral 05/29/2018    History reviewed. No pertinent surgical history.   OB History   No obstetric history on file.     No family history on file.  Social History   Tobacco Use  . Smoking status: Former Smoker    Quit date: 01/14/2013    Years since quitting: 7.3  . Smokeless tobacco: Never Used  Substance Use Topics  . Alcohol use: No  . Drug use: No    Home Medications Prior to Admission medications   Medication Sig Start Date End Date Taking? Authorizing Provider  amLODipine (NORVASC) 10 MG tablet Take 10 mg by mouth daily.    [provider]  amoxicillin-clavulanate (AUGMENTIN) 875-125 MG tablet Take 1 tablet by mouth 2 (two) times daily. 09/30/18   Rutherford Guys, MD  carvedilol (COREG) 3.125 MG tablet Take 3.125 mg by mouth 2 (two) times daily with a meal.    [provider]  fluticasone (FLONASE) 50 MCG/ACT nasal spray Place 1 spray into both nostrils 2 (two) times daily. 09/30/18   Rutherford Guys, MD  furosemide (LASIX) 20 MG tablet Take 20 mg by mouth.    [provider]  hydrochlorothiazide (HYDRODIURIL) 25 MG tablet Take 25 mg by mouth daily.    [provider]  meloxicam (MOBIC) 7.5 MG tablet Take 1 tablet (7.5 mg total) by mouth daily. 05/29/20   Carlisle Cater, PA-C  methocarbamol (ROBAXIN) 500 MG tablet Take 2 tablets (1,000 mg total) by mouth 4 (four) times daily. 05/29/20   Carlisle Cater, PA-C  olopatadine (PATANOL) 0.1 % ophthalmic solution Place 1 drop into both eyes 2 (two) times daily. 09/30/18   Rutherford Guys, MD  Olopatadine HCl 0.2 % SOLN Apply 1 drop to eye daily. 05/28/18   Forrest Moron, MD  traMADol (ULTRAM) 50 MG tablet Take 1 tablet (50 mg total) by mouth every 8 (eight) hours as needed. 06/21/18   Horald Pollen, MD    Allergies    Patient has no known allergies.  Review of Systems   Review of Systems  Constitutional: Negative for activity change.  Musculoskeletal: Positive for arthralgias and myalgias. Negative for back pain, joint swelling and neck pain.  Skin: Negative for wound.  Neurological:  Negative for weakness and numbness.    Physical Exam Updated Vital Signs BP (!) 123/106   Pulse 98   Temp 98.3 F (36.8 C) (Oral)   Resp 17   Ht 5\' 5"  (1.651 m)   Wt 90.7 kg   LMP 05/07/2019 (Approximate)   SpO2 99%   BMI 33.28 kg/m   Physical Exam Vitals and nursing note reviewed.  Constitutional:      Appearance: She is well-developed.  HENT:     Head: Normocephalic and atraumatic.  Eyes:     Pupils: Pupils are equal, round, and reactive to light.  Cardiovascular:     Pulses: Normal pulses. No decreased pulses.  Musculoskeletal:        General: Tenderness present.     Right shoulder: No tenderness.     Left shoulder: Tenderness (Trapezius) present. No bony tenderness. Normal  range of motion.     Right upper arm: No tenderness or bony tenderness.     Left upper arm: No tenderness or bony tenderness.     Right elbow: No tenderness.     Left elbow: Normal range of motion. No tenderness.     Left wrist: No bony tenderness. Normal range of motion.     Cervical back: Normal range of motion and neck supple. No spasms or tenderness. Normal range of motion.     Thoracic back: No tenderness.  Skin:    General: Skin is warm and dry.  Neurological:     Mental Status: She is alert.     Sensory: No sensory deficit.     Comments: Motor, sensation, and vascular distal to the injury is fully intact.      ED Results / Procedures / Treatments   Labs (all labs ordered are listed, but only abnormal results are displayed) Labs Reviewed - No data to display  EKG None  Radiology DG Shoulder Left  Result Date: 05/29/2020 CLINICAL DATA:  Shoulder pain EXAM: LEFT SHOULDER - 2+ VIEW COMPARISON:  None. FINDINGS: There is no evidence of fracture or dislocation. There is no evidence of arthropathy or other focal bone abnormality. Soft tissues are unremarkable. IMPRESSION: No acute or focal osseous abnormality. Electronically Signed   By: Primitivo Gauze M.D.   On: 05/29/2020 10:48    Procedures Procedures (including critical care time)  Medications Ordered in ED Medications - No data to display  ED Course  I have reviewed the triage vital signs and the nursing notes.  Pertinent labs & imaging results that were available during my care of the patient were reviewed by me and considered in my medical decision making (see chart for details).  Patient seen and examined.  Patient updated on x-ray results.  Plan meloxicam and Robaxin.  Encouraged rest, ice and heat, orthopedic follow-up if not improved in 1 week.  Patient verbalizes understanding agrees with plan.  Vital signs reviewed and are as follows: BP (!) 123/106   Pulse 98   Temp 98.3 F (36.8 C) (Oral)   Resp 17    Ht 5\' 5"  (1.651 m)   Wt 90.7 kg   LMP 05/07/2019 (Approximate)   SpO2 99%   BMI 33.28 kg/m     MDM Rules/Calculators/A&P                          Patient with left shoulder pain, worse with movement.  Suspect overuse injury.  Left upper extremity is neurovascularly intact.   Final Clinical Impression(s) / ED  Diagnoses Final diagnoses:  Acute pain of left shoulder    Rx / DC Orders ED Discharge Orders         Ordered    methocarbamol (ROBAXIN) 500 MG tablet  4 times daily        05/29/20 1220    meloxicam (MOBIC) 7.5 MG tablet  Daily        05/29/20 1220           Carlisle Cater, PA-C 05/29/20 1231    Milton Ferguson, MD 05/31/20 1028

## 2020-05-29 NOTE — Discharge Instructions (Signed)
Please read and follow all provided instructions.  Your diagnoses today include:  1. Acute pain of left shoulder     Tests performed today include:  An x-ray of the affected area - does NOT show any broken bones  Vital signs. See below for your results today.   Medications prescribed:   Meloxicam - anti-inflammatory pain medication  You have been prescribed an anti-inflammatory medication or NSAID. Take with food. Do not take aspirin, ibuprofen, or naproxen if taking this medication. Take smallest effective dose for the shortest duration needed for your pain. Stop taking if you experience stomach pain or vomiting.    Robaxin (methocarbamol) - muscle relaxer medication  DO NOT drive or perform any activities that require you to be awake and alert because this medicine can make you drowsy.   Take any prescribed medications only as directed.  Home care instructions:   Follow any educational materials contained in this packet  Follow R.I.C.E. Protocol:  R - rest your injury   I  - use ice on injury without applying directly to skin  C - compress injury with bandage or splint  E - elevate the injury as much as possible  Follow-up instructions: Please follow-up with your primary care provider or the provided orthopedic physician (bone specialist) if you continue to have significant pain in 1 week. In this case you may have a more severe injury that requires further care.   Return instructions:   Please return if your fingers are numb or tingling, appear gray or blue, or you have severe pain (also elevate the arm and loosen splint or wrap if you were given one)  Please return to the Emergency Department if you experience worsening symptoms.   Please return if you have any other emergent concerns.  Additional Information:  Your vital signs today were: BP (!) 123/106    Pulse 98    Temp 98.3 F (36.8 C) (Oral)    Resp 17    Ht 5\' 5"  (1.651 m)    Wt 90.7 kg    LMP  05/07/2019 (Approximate)    SpO2 99%    BMI 33.28 kg/m  If your blood pressure (BP) was elevated above 135/85 this visit, please have this repeated by your doctor within one month. --------------

## 2021-03-19 ENCOUNTER — Ambulatory Visit: Payer: Self-pay | Admitting: Pharmacy Technician

## 2021-03-19 ENCOUNTER — Other Ambulatory Visit: Payer: Self-pay

## 2021-03-19 DIAGNOSIS — Z79899 Other long term (current) drug therapy: Secondary | ICD-10-CM

## 2021-03-20 ENCOUNTER — Other Ambulatory Visit: Payer: Self-pay

## 2021-03-20 MED ORDER — SPIRONOLACTONE 25 MG PO TABS
ORAL_TABLET | ORAL | 11 refills | Status: DC
Start: 2021-03-20 — End: 2021-08-21
  Filled 2021-03-20: qty 30, 30d supply, fill #0
  Filled 2021-05-05: qty 30, 30d supply, fill #1
  Filled 2021-06-10: qty 30, 30d supply, fill #2

## 2021-03-20 MED ORDER — CARVEDILOL 6.25 MG PO TABS
ORAL_TABLET | ORAL | 3 refills | Status: AC
  Filled 2021-03-20: qty 60, 30d supply, fill #0
  Filled 2021-05-05: qty 60, 30d supply, fill #1
  Filled 2021-08-28: qty 60, 30d supply, fill #2
  Filled 2021-10-20: qty 60, 30d supply, fill #3
  Filled 2021-12-17 – 2021-12-18 (×2): qty 60, 30d supply, fill #0

## 2021-03-21 ENCOUNTER — Other Ambulatory Visit: Payer: Self-pay

## 2021-03-21 MED ORDER — TRIAMCINOLONE ACETONIDE 0.025 % EX LOTN
TOPICAL_LOTION | CUTANEOUS | 1 refills | Status: DC
  Filled 2021-03-21: qty 60, 30d supply, fill #0
  Filled 2021-06-29: qty 60, fill #0

## 2021-03-21 MED ORDER — CARVEDILOL 6.25 MG PO TABS
6.2500 mg | ORAL_TABLET | Freq: Every day | ORAL | 3 refills | Status: DC
Start: 2021-03-21 — End: 2021-03-24
  Filled 2021-03-21: qty 180, 180d supply, fill #0

## 2021-03-21 MED ORDER — TRIAMTERENE-HCTZ 37.5-25 MG PO CAPS
ORAL_CAPSULE | Freq: Every day | ORAL | 3 refills | Status: AC
  Filled 2021-03-21: qty 30, 30d supply, fill #0
  Filled 2021-06-29: qty 30, 30d supply, fill #1
  Filled 2021-12-17: qty 30, 30d supply, fill #0
  Filled 2021-12-18: qty 10, 10d supply, fill #0
  Filled 2021-12-18: qty 20, 20d supply, fill #0

## 2021-03-21 MED ORDER — AMLODIPINE BESYLATE 10 MG PO TABS
10.0000 mg | ORAL_TABLET | Freq: Every day | ORAL | 3 refills | Status: AC
  Filled 2021-03-21: qty 30, 30d supply, fill #0
  Filled 2021-05-05: qty 30, 30d supply, fill #1
  Filled 2021-06-10: qty 30, 30d supply, fill #2
  Filled 2021-07-09: qty 30, 30d supply, fill #3
  Filled 2021-08-28: qty 30, 30d supply, fill #4
  Filled 2021-10-20: qty 30, 30d supply, fill #5
  Filled 2021-12-17 – 2021-12-18 (×2): qty 30, 30d supply, fill #0
  Filled 2022-02-06: qty 30, 30d supply, fill #1

## 2021-03-21 NOTE — Progress Notes (Signed)
Completed [facility] application and contract.  Patient agreed to all terms of the [facility] contract.    Patient approved to receive medication assistance at [facility] until time for re-certification in 2023, and as long as eligibility criteria continues to be met.    Provided patient with community resource material based on her particular needs.    Jaella Weinert J. Gianluca Chhim Care Manager [facility]  

## 2021-03-24 ENCOUNTER — Other Ambulatory Visit: Payer: Self-pay

## 2021-03-25 ENCOUNTER — Other Ambulatory Visit: Payer: Self-pay

## 2021-03-26 ENCOUNTER — Other Ambulatory Visit: Payer: Self-pay

## 2021-03-27 ENCOUNTER — Other Ambulatory Visit: Payer: Self-pay

## 2021-03-28 ENCOUNTER — Other Ambulatory Visit: Payer: Self-pay

## 2021-03-28 ENCOUNTER — Other Ambulatory Visit: Payer: Self-pay | Admitting: Family Medicine

## 2021-03-28 DIAGNOSIS — Z1231 Encounter for screening mammogram for malignant neoplasm of breast: Secondary | ICD-10-CM

## 2021-04-07 ENCOUNTER — Other Ambulatory Visit: Payer: Self-pay

## 2021-04-15 ENCOUNTER — Other Ambulatory Visit: Payer: Self-pay

## 2021-04-17 ENCOUNTER — Other Ambulatory Visit: Payer: Self-pay

## 2021-04-17 MED ORDER — PAXLOVID (300/100) 20 X 150 MG & 10 X 100MG PO TBPK: ORAL_TABLET | ORAL | 0 refills | Status: AC

## 2021-05-05 ENCOUNTER — Other Ambulatory Visit: Payer: Self-pay

## 2021-05-06 ENCOUNTER — Other Ambulatory Visit: Payer: Self-pay

## 2021-05-06 MED ORDER — TRIAMCINOLONE ACETONIDE 0.025 % EX CREA
TOPICAL_CREAM | CUTANEOUS | 1 refills | Status: AC
  Filled 2021-05-06: qty 80, 30d supply, fill #0
  Filled 2021-06-29: qty 80, 30d supply, fill #1

## 2021-05-07 ENCOUNTER — Other Ambulatory Visit: Payer: Self-pay

## 2021-05-07 ENCOUNTER — Ambulatory Visit: Payer: Self-pay

## 2021-05-08 ENCOUNTER — Other Ambulatory Visit: Payer: Self-pay

## 2021-06-11 ENCOUNTER — Other Ambulatory Visit: Payer: Self-pay

## 2021-06-30 ENCOUNTER — Other Ambulatory Visit: Payer: Self-pay

## 2021-07-09 ENCOUNTER — Other Ambulatory Visit: Payer: Self-pay

## 2021-07-10 ENCOUNTER — Other Ambulatory Visit: Payer: Self-pay

## 2021-08-20 ENCOUNTER — Other Ambulatory Visit: Payer: Self-pay

## 2021-08-20 MED ORDER — AZELAIC ACID 15 % EX GEL: CUTANEOUS | 5 refills | Status: DC

## 2021-08-20 MED ORDER — SPIRONOLACTONE 50 MG PO TABS
50.0000 mg | ORAL_TABLET | Freq: Every day | ORAL | 4 refills | Status: AC
  Filled 2021-08-20: qty 30, 30d supply, fill #0
  Filled 2021-10-20: qty 30, 30d supply, fill #1
  Filled 2021-12-17 – 2021-12-18 (×2): qty 30, 30d supply, fill #0

## 2021-08-21 ENCOUNTER — Other Ambulatory Visit: Payer: Self-pay

## 2021-08-26 ENCOUNTER — Other Ambulatory Visit: Payer: Self-pay

## 2021-08-26 MED ORDER — HYDROQUINONE 4 % EX CREA
TOPICAL_CREAM | CUTANEOUS | 2 refills | Status: AC
  Filled 2021-08-26: qty 28.35, 30d supply, fill #0
  Filled 2021-10-20: qty 28.35, 30d supply, fill #1
  Filled 2021-11-24 – 2022-02-06 (×3): qty 28.35, 30d supply, fill #0

## 2021-08-27 ENCOUNTER — Other Ambulatory Visit: Payer: Self-pay

## 2021-08-28 ENCOUNTER — Other Ambulatory Visit: Payer: Self-pay

## 2021-09-18 ENCOUNTER — Other Ambulatory Visit: Payer: Self-pay

## 2021-09-18 MED ORDER — NAPROXEN 500 MG PO TABS
ORAL_TABLET | Freq: Two times a day (BID) | ORAL | 0 refills | Status: AC
  Filled 2021-09-18: qty 20, 10d supply, fill #0

## 2021-09-19 ENCOUNTER — Other Ambulatory Visit: Payer: Self-pay

## 2021-10-17 ENCOUNTER — Telehealth: Payer: Self-pay | Admitting: Pharmacy Technician

## 2021-10-17 NOTE — Telephone Encounter (Signed)
Received updated proof of income.  Patient eligible to receive medication assistance at [facility] until time for re-certification in 2024, and as long as eligibility requirements continue to be met. ? ?Veronica Holt J. Hatem Cull ?Care Manager ?[facility]  ?

## 2021-10-20 ENCOUNTER — Other Ambulatory Visit: Payer: Self-pay

## 2021-10-21 ENCOUNTER — Other Ambulatory Visit: Payer: Self-pay

## 2021-10-22 ENCOUNTER — Other Ambulatory Visit: Payer: Self-pay

## 2021-10-23 ENCOUNTER — Other Ambulatory Visit: Payer: Self-pay

## 2021-10-24 ENCOUNTER — Other Ambulatory Visit: Payer: Self-pay

## 2021-12-03 ENCOUNTER — Other Ambulatory Visit (HOSPITAL_COMMUNITY): Payer: Self-pay

## 2021-12-18 ENCOUNTER — Other Ambulatory Visit: Payer: Self-pay

## 2021-12-18 ENCOUNTER — Other Ambulatory Visit (HOSPITAL_COMMUNITY): Payer: Self-pay

## 2021-12-24 ENCOUNTER — Other Ambulatory Visit: Payer: Self-pay

## 2021-12-25 ENCOUNTER — Other Ambulatory Visit: Payer: Self-pay

## 2022-02-05 ENCOUNTER — Other Ambulatory Visit: Payer: Self-pay

## 2022-02-06 ENCOUNTER — Other Ambulatory Visit: Payer: Self-pay

## 2022-04-17 IMAGING — CR DG SHOULDER 2+V*L*
3 series · 3 of 3 positions shown · non-contrast
Comparison: None.

CLINICAL DATA: Shoulder pain

EXAM:
LEFT SHOULDER - 2+ VIEW

[w shoulder external left]
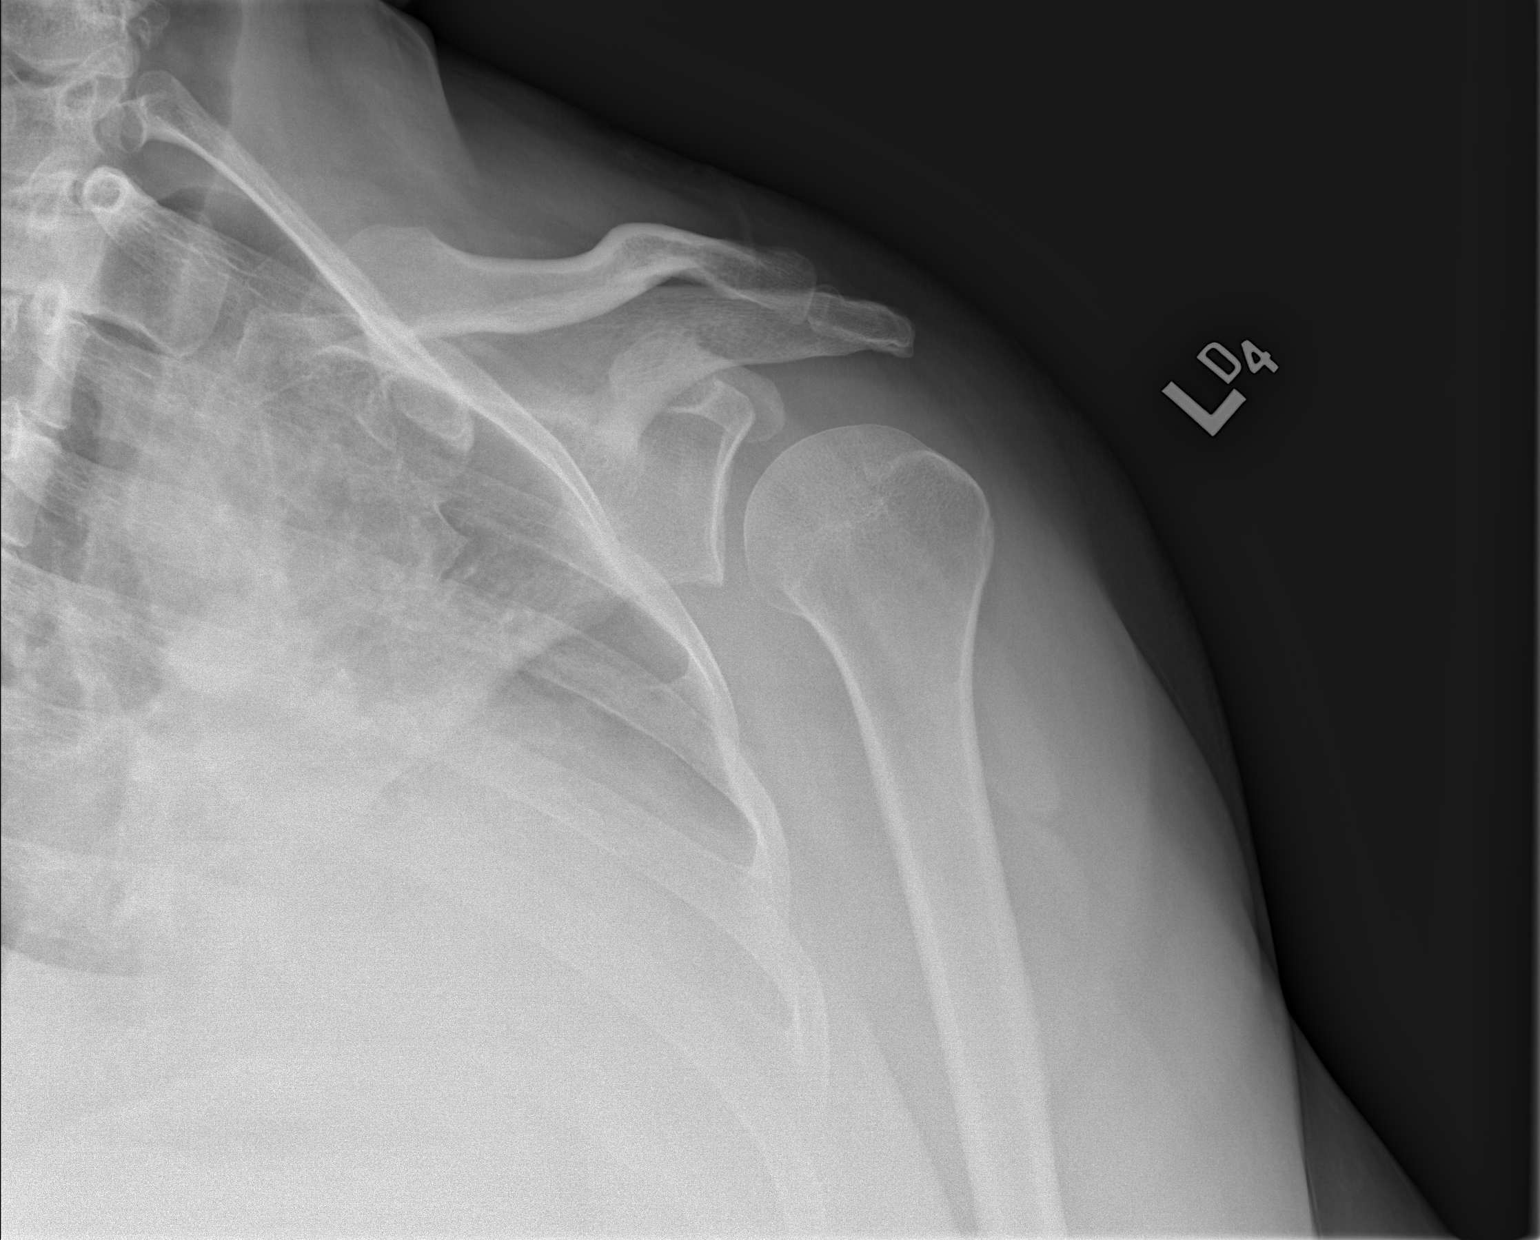

[w shoulder y-view left]
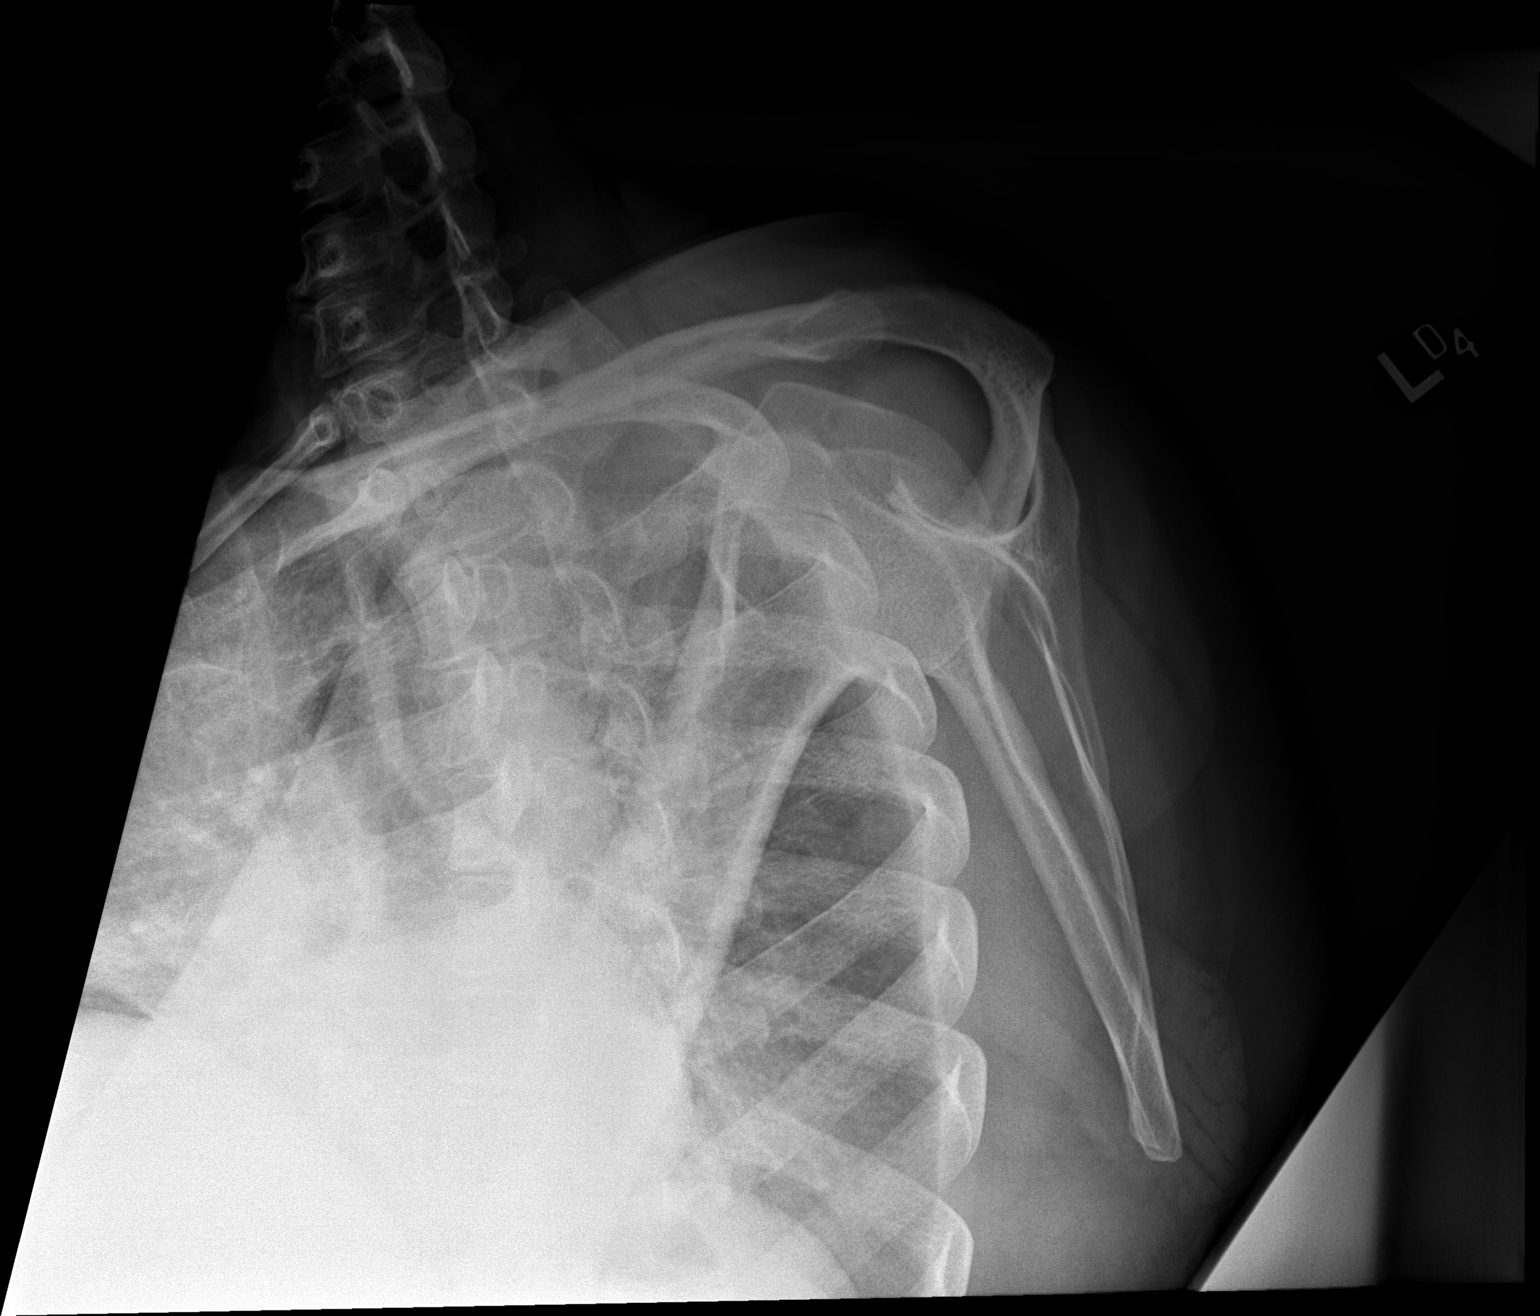

[x shoulder axillary left]
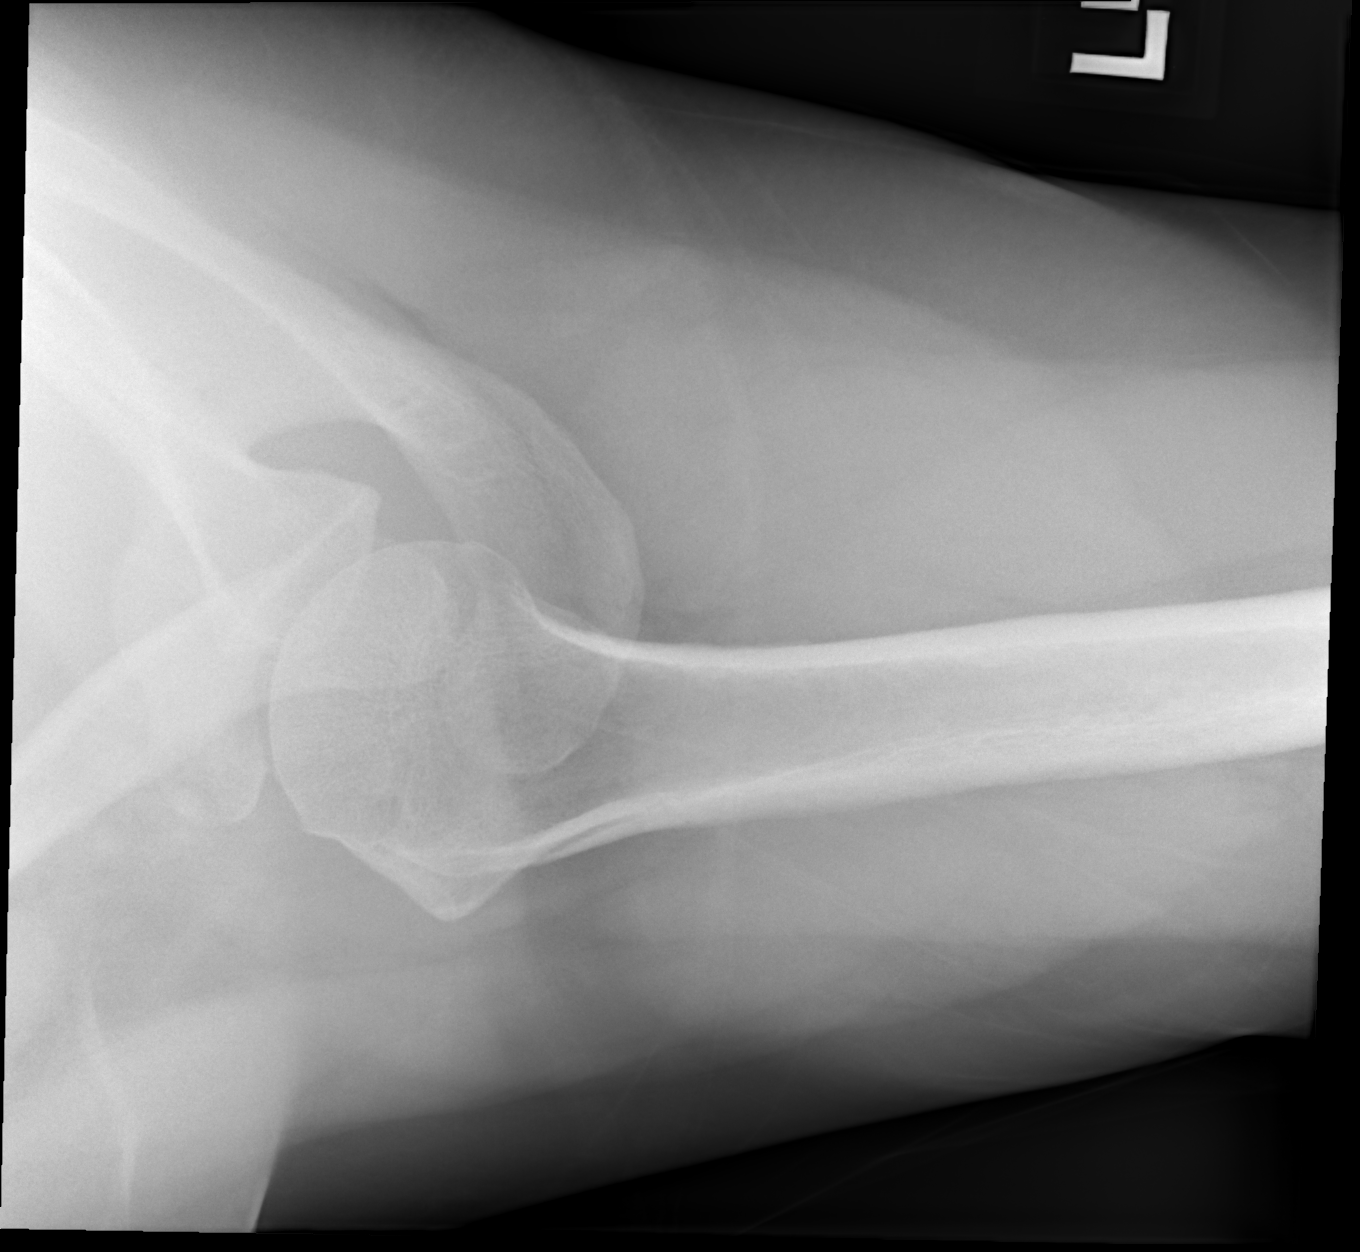

[3 of 3 positions shown; findings below may reference images not displayed]

FINDINGS: There is no evidence of fracture or dislocation. There is no
evidence of arthropathy or other focal bone abnormality. Soft
tissues are unremarkable.
IMPRESSION: No acute or focal osseous abnormality.

## 2023-03-02 ENCOUNTER — Other Ambulatory Visit: Payer: Self-pay
